# Patient Record
Sex: Male | Born: 1965 | Race: White | Hispanic: No | Marital: Married | Smoking: Current every day smoker
Health system: Southern US, Community
[De-identification: ages and names within clinical notes are randomized; demographics above are authoritative.]

## PROBLEM LIST (undated history)

## (undated) DIAGNOSIS — Z8639 Personal history of other endocrine, nutritional and metabolic disease: Secondary | ICD-10-CM

## (undated) DIAGNOSIS — J439 Emphysema, unspecified: Secondary | ICD-10-CM

## (undated) DIAGNOSIS — F419 Anxiety disorder, unspecified: Secondary | ICD-10-CM

## (undated) DIAGNOSIS — M169 Osteoarthritis of hip, unspecified: Secondary | ICD-10-CM

## (undated) DIAGNOSIS — Z8709 Personal history of other diseases of the respiratory system: Secondary | ICD-10-CM

## (undated) DIAGNOSIS — Z9089 Acquired absence of other organs: Secondary | ICD-10-CM

## (undated) DIAGNOSIS — Z8042 Family history of malignant neoplasm of prostate: Secondary | ICD-10-CM

## (undated) DIAGNOSIS — C61 Malignant neoplasm of prostate: Secondary | ICD-10-CM

## (undated) DIAGNOSIS — D110 Benign neoplasm of parotid gland: Secondary | ICD-10-CM

## (undated) DIAGNOSIS — M519 Unspecified thoracic, thoracolumbar and lumbosacral intervertebral disc disorder: Secondary | ICD-10-CM

## (undated) DIAGNOSIS — M5430 Sciatica, unspecified side: Secondary | ICD-10-CM

## (undated) DIAGNOSIS — J342 Deviated nasal septum: Secondary | ICD-10-CM

## (undated) DIAGNOSIS — K5730 Diverticulosis of large intestine without perforation or abscess without bleeding: Secondary | ICD-10-CM

## (undated) DIAGNOSIS — R102 Pelvic and perineal pain unspecified side: Secondary | ICD-10-CM

## (undated) DIAGNOSIS — T8859XA Other complications of anesthesia, initial encounter: Secondary | ICD-10-CM

## (undated) DIAGNOSIS — T4145XA Adverse effect of unspecified anesthetic, initial encounter: Secondary | ICD-10-CM

## (undated) DIAGNOSIS — Z807 Family history of other malignant neoplasms of lymphoid, hematopoietic and related tissues: Secondary | ICD-10-CM

## (undated) DIAGNOSIS — E041 Nontoxic single thyroid nodule: Secondary | ICD-10-CM

## (undated) DIAGNOSIS — G8929 Other chronic pain: Secondary | ICD-10-CM

## (undated) HISTORY — PX: TONSILLECTOMY: SUR1361

## (undated) HISTORY — DX: Emphysema, unspecified: J43.9

## (undated) HISTORY — DX: Family history of other malignant neoplasms of lymphoid, hematopoietic and related tissues: Z80.7

## (undated) HISTORY — PX: VASECTOMY: SHX75

## (undated) HISTORY — DX: Family history of malignant neoplasm of prostate: Z80.42

## (undated) HISTORY — PX: COLONOSCOPY: SHX174

## (undated) HISTORY — DX: Nontoxic single thyroid nodule: E04.1

## (undated) HISTORY — PX: PROSTATE BIOPSY: SHX241

## (undated) HISTORY — DX: Acquired absence of other organs: Z90.89

## (undated) HISTORY — PX: PAROTID GLAND TUMOR EXCISION: SHX5221

---

## 1994-07-08 HISTORY — PX: LUMBAR LAMINECTOMY: SHX95

## 2005-04-05 ENCOUNTER — Ambulatory Visit: Payer: Self-pay | Admitting: Internal Medicine

## 2006-03-28 ENCOUNTER — Ambulatory Visit: Payer: Self-pay | Admitting: Internal Medicine

## 2006-08-15 ENCOUNTER — Ambulatory Visit: Payer: Self-pay | Admitting: Internal Medicine

## 2006-09-12 ENCOUNTER — Ambulatory Visit: Payer: Self-pay | Admitting: Internal Medicine

## 2007-04-27 ENCOUNTER — Encounter: Admission: RE | Admit: 2007-04-27 | Discharge: 2007-04-27 | Payer: Self-pay | Admitting: Orthopedic Surgery

## 2010-05-08 ENCOUNTER — Ambulatory Visit: Payer: Self-pay | Admitting: Family Medicine

## 2010-05-08 DIAGNOSIS — S0083XA Contusion of other part of head, initial encounter: Secondary | ICD-10-CM

## 2010-05-08 DIAGNOSIS — S0003XA Contusion of scalp, initial encounter: Secondary | ICD-10-CM | POA: Insufficient documentation

## 2010-05-08 DIAGNOSIS — S139XXA Sprain of joints and ligaments of unspecified parts of neck, initial encounter: Secondary | ICD-10-CM | POA: Insufficient documentation

## 2010-05-08 DIAGNOSIS — S1093XA Contusion of unspecified part of neck, initial encounter: Secondary | ICD-10-CM

## 2010-05-08 DIAGNOSIS — J209 Acute bronchitis, unspecified: Secondary | ICD-10-CM | POA: Insufficient documentation

## 2010-05-10 ENCOUNTER — Telehealth: Payer: Self-pay | Admitting: Family Medicine

## 2010-05-18 DIAGNOSIS — R51 Headache: Secondary | ICD-10-CM | POA: Insufficient documentation

## 2010-05-18 DIAGNOSIS — R519 Headache, unspecified: Secondary | ICD-10-CM | POA: Insufficient documentation

## 2010-05-21 ENCOUNTER — Ambulatory Visit: Payer: Self-pay | Admitting: Internal Medicine

## 2010-05-28 ENCOUNTER — Ambulatory Visit: Payer: Self-pay | Admitting: Family Medicine

## 2010-05-28 DIAGNOSIS — IMO0002 Reserved for concepts with insufficient information to code with codable children: Secondary | ICD-10-CM | POA: Insufficient documentation

## 2010-06-19 ENCOUNTER — Telehealth: Payer: Self-pay | Admitting: Family Medicine

## 2010-06-19 DIAGNOSIS — M5430 Sciatica, unspecified side: Secondary | ICD-10-CM | POA: Insufficient documentation

## 2010-07-17 ENCOUNTER — Encounter: Payer: Self-pay | Admitting: Family Medicine
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
## 2010-08-07 NOTE — Progress Notes (Signed)
Summary: neurology  Phone Note Call from Patient   Caller: Patient Call For: [phone] Summary of Call: [phone] Mom states his symptoms are no better and want a referral to a neurologist ASAP.  Dr. Clent Ridges off this afternoon. Initial call taken by: [facility] CMA AAMA,  May 10, 2010 1:34 PM  Follow-up for Phone Call        there is no need to see a neurologist at this point. If he is still having a HA, set him up today for a non-contrasted head CT scan  Follow-up by: Nelwyn Salisbury MD,  May 11, 2010 10:25 AM  Additional Follow-up for Phone Call Additional follow up Details #1::        I left a message at [phone] asking mom to return my call. Tried [phone] also and it states phone is off or out of service area. Additional Follow-up by: Josph Macho RMA,  May 11, 2010 10:31 AM    Additional Follow-up for Phone Call Additional follow up Details #2::    spoke with mom and she will call back if patient would like a CT. Follow-up by: Kern Reap CMA Duncan Dull),  May 11, 2010 3:13 PM  Additional Follow-up for Phone Call Additional follow up Details #3:: Details for Additional Follow-up Action Taken: have not received call back yet  Additional Follow-up by: Pura Spice, RN,  May 15, 2010 8:23 AM   Appended Document: neurology pt called back and wants referral to have CT call him back at [phone]  symptoms "tingle sensation " across the scalp  Appended Document: CT Scan head referral   per Dr Clent Ridges sch a non contrasted head CT for headaches.referral request sent to [facility].   Clinical Lists Changes  Problems: Added new problem of HEADACHE (ICD-784.0) - Signed Orders: Added new Referral order of Radiology Referral (Radiology) - Signed
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
## 2010-08-09 NOTE — Letter (Signed)
Summary: [facility]   Imported By: Maryln Gottron 07/26/2010 10:16:40  _____________________________________________________________________  External Attachment:    Type:   Image     Comment:   External Document

## 2010-08-09 NOTE — Progress Notes (Signed)
Summary: wants referral  Phone Note Call from Patient Call back at Work Phone [phone]   Caller: Patient---live call Summary of Call: sciatica nerve is still causing him problems in his left leg. was told to call if referral is needed. please referral. Initial call taken by: Warnell Forester,  June 19, 2010 10:50 AM  Follow-up for Phone Call        refer to Dr. Sheran Luz for left sciatica pain  Follow-up by: Nelwyn Salisbury MD,  June 20, 2010 9:54 AM  New Problems: SCIATICA, ACUTE (ICD-724.3)   New Problems: SCIATICA, ACUTE (ICD-724.3)

## 2011-01-08 ENCOUNTER — Other Ambulatory Visit: Payer: Self-pay | Admitting: Neurological Surgery

## 2011-01-08 DIAGNOSIS — M545 Low back pain, unspecified: Secondary | ICD-10-CM

## 2011-01-14 ENCOUNTER — Ambulatory Visit
Admission: RE | Admit: 2011-01-14 | Discharge: 2011-01-14 | Disposition: A | Discharge status: Home / Self Care | Payer: BC Managed Care – PPO | Source: Ambulatory Visit | Attending: Neurological Surgery | Admitting: Neurological Surgery

## 2011-01-14 DIAGNOSIS — M545 Low back pain, unspecified: Secondary | ICD-10-CM

## 2011-01-14 MED ORDER — GADOBENATE DIMEGLUMINE 529 MG/ML IV SOLN
18.0000 mL | Freq: Once | INTRAVENOUS | Status: AC | PRN
  Administered 2011-01-14: 18 mL via INTRAVENOUS

## 2012-03-31 ENCOUNTER — Encounter: Payer: Self-pay | Admitting: Family Medicine

## 2012-03-31 ENCOUNTER — Ambulatory Visit: Payer: BC Managed Care – PPO | Admitting: Family Medicine

## 2013-12-23 ENCOUNTER — Telehealth: Payer: Self-pay | Admitting: Family Medicine

## 2013-12-23 NOTE — Telephone Encounter (Signed)
Can you call pt to schedule the office visit?

## 2013-12-23 NOTE — Telephone Encounter (Signed)
lmovm for pt to sch appt

## 2013-12-23 NOTE — Telephone Encounter (Signed)
Pt's wife called to ask for a rx cialis. Pt states he gets from urologist and he is out of town. Does pt need appt? Pt would rather come here now for this rx b.c urologist is so costly. pls advise [facility] [place]

## 2013-12-23 NOTE — Telephone Encounter (Signed)
I can prescribe this for him but he will need an OV. We have not seen him in years

## 2013-12-27 ENCOUNTER — Ambulatory Visit (INDEPENDENT_AMBULATORY_CARE_PROVIDER_SITE_OTHER): Payer: Self-pay | Admitting: Family Medicine

## 2013-12-27 ENCOUNTER — Encounter: Payer: Self-pay | Admitting: Family Medicine

## 2013-12-27 VITALS — BP 100/76 | HR 86 | Temp 98.6°F | Wt 194.5 lb

## 2013-12-27 DIAGNOSIS — N529 Male erectile dysfunction, unspecified: Secondary | ICD-10-CM

## 2013-12-27 DIAGNOSIS — M25569 Pain in unspecified knee: Secondary | ICD-10-CM

## 2013-12-27 DIAGNOSIS — M25561 Pain in right knee: Secondary | ICD-10-CM

## 2013-12-27 MED ORDER — SILDENAFIL CITRATE 50 MG PO TABS: 50.0000 mg | ORAL_TABLET | Freq: Every day | ORAL | Status: DC | PRN

## 2013-12-27 NOTE — Patient Instructions (Signed)
-  tylenol 500-1000mg  up to 3 times daily or aleve twice daily for pain as needed  -please do the home exercise programs  -follow up with Dr. Sarajane Jews in 1 month

## 2013-12-27 NOTE — Progress Notes (Signed)
No chief complaint on file.   HPI:  Acute visit for Knee Pain: -PCP, Dr. Sarajane Jews, not available -started: L knee pain started 4 months ago -reports: on an off since then in knee joint and above knee, very active, gardner - squatting up and down - this is limiting activities -denies: fevers, malaise, weakness, numbness, swelling, redness, weight loss  ED: -[facility] and wants [facility]   ROS: See pertinent positives and negatives per HPI.  Past Medical History  Diagnosis Date  . History of tonsillectomy     Past Surgical History  Procedure Laterality Date  . Lumbar laminectomy  1996  . Tonsillectomy      Family History  Problem Relation Age of Onset  . Coronary artery disease    . Lung cancer      History   Social History  . Marital Status: Married    Spouse Name: N/A    Number of Children: N/A  . Years of Education: N/A   Social History Main Topics  . Smoking status: Current Every Day Smoker    Types: E-cigarettes  . Smokeless tobacco: None  . Alcohol Use: None  . Drug Use: None  . Sexual Activity: None   Other Topics Concern  . None   Social History Narrative  . None    Current outpatient prescriptions:ACETAMINOPHEN PO, Take by mouth as needed. , Disp: , Rfl: ;  aspirin 325 MG tablet, Take 325 mg by mouth as needed. , Disp: , Rfl: ;  sildenafil (VIAGRA) 50 MG tablet, Take 1 tablet (50 mg total) by mouth daily as needed for erectile dysfunction., Disp: 10 tablet, Rfl: 0  EXAM:  Filed Vitals:   12/27/13 1603  BP: 100/76  Pulse: 86  Temp: 98.6 F (37 C)    There is no height on file to calculate BMI.  GENERAL: vitals reviewed and listed above, alert, oriented, appears well hydrated and in no acute distress  HEENT: atraumatic, conjunttiva clear, no obvious abnormalities on inspection of external nose and ears  NECK: no obvious masses on inspection  LUNGS: clear to auscultation bilaterally, no wheezes, rales or  rhonchi, good air movement  CV: HRRR, no peripheral edema  MS: moves all extremities without noticeable abnormality, gait normal, normal inspection, no swleling or redness, TTP mild Quad tendon, no jt line ttp, no pat crepitus, neg lachman, neg val/var stress, neg mcmurry, neg single leg squat  PSYCH: pleasant and cooperative, no obvious depression or anxiety  ASSESSMENT AND PLAN:  Discussed the following assessment and plan:  Erectile dysfunction, unspecified erectile dysfunction type - Plan: sildenafil (VIAGRA) 50 MG tablet  Right knee pain  -? Tendonopathy, disucssed options, offered plain films - opted for trial HEP -discussed risks viagra, refilled for one month, further refills per PCP -Patient advised to return or notify a doctor immediately if symptoms worsen or persist or new concerns arise.  Patient Instructions  -tylenol 500-1000mg  up to 3 times daily or aleve twice daily for pain as needed  -please do the home exercise programs  -follow up with Dr. Sarajane Jews in 1 month     KIM, Nickola Major.

## 2013-12-27 NOTE — Progress Notes (Signed)
Pre visit review using our clinic review tool, if applicable. No additional management support is needed unless otherwise documented below in the visit note. 

## 2013-12-28 ENCOUNTER — Telehealth: Payer: Self-pay | Admitting: Family Medicine

## 2013-12-28 NOTE — Telephone Encounter (Signed)
Relevant patient education mailed to patient.  

## 2013-12-29 NOTE — Telephone Encounter (Signed)
Pt came in to see dr Maudie Mercury for a foot issue. Was also given viagra at visit.

## 2014-01-10 ENCOUNTER — Other Ambulatory Visit: Payer: Self-pay | Admitting: Family Medicine

## 2014-01-11 ENCOUNTER — Telehealth: Payer: Self-pay | Admitting: Family Medicine

## 2014-01-11 NOTE — Telephone Encounter (Signed)
Pharmacy request to clarify dosage on Viagra 50 mg or 100 mg? [facility] for 100 mg.

## 2014-01-13 NOTE — Telephone Encounter (Signed)
This should be for 100 mg of Viagra

## 2014-01-13 NOTE — Telephone Encounter (Signed)
Pt called to check the status of his Viagra 100 mg. He confirms his pharmacy is [facility] on [address]

## 2014-01-14 MED ORDER — SILDENAFIL CITRATE 100 MG PO TABS: 100.0000 mg | ORAL_TABLET | Freq: Every day | ORAL | Status: DC | PRN

## 2014-01-14 NOTE — Telephone Encounter (Signed)
I sent script e-scribe and spoke with pt. 

## 2015-06-12 ENCOUNTER — Encounter: Payer: Self-pay | Admitting: Family Medicine

## 2015-06-12 ENCOUNTER — Ambulatory Visit (INDEPENDENT_AMBULATORY_CARE_PROVIDER_SITE_OTHER): Payer: Self-pay | Admitting: Family Medicine

## 2015-06-12 VITALS — BP 102/67 | HR 90 | Temp 98.7°F | Ht 71.0 in | Wt 198.0 lb

## 2015-06-12 DIAGNOSIS — Z91048 Other nonmedicinal substance allergy status: Secondary | ICD-10-CM

## 2015-06-12 DIAGNOSIS — M25512 Pain in left shoulder: Secondary | ICD-10-CM

## 2015-06-12 DIAGNOSIS — L0292 Furuncle, unspecified: Secondary | ICD-10-CM

## 2015-06-12 DIAGNOSIS — Z9109 Other allergy status, other than to drugs and biological substances: Secondary | ICD-10-CM

## 2015-06-12 MED ORDER — CYCLOBENZAPRINE HCL 10 MG PO TABS: 10.0000 mg | ORAL_TABLET | Freq: Three times a day (TID) | ORAL | Status: DC | PRN

## 2015-06-12 MED ORDER — DOXYCYCLINE HYCLATE 100 MG PO CAPS: 100.0000 mg | ORAL_CAPSULE | Freq: Two times a day (BID) | ORAL | Status: AC

## 2015-06-12 NOTE — Progress Notes (Signed)
   Subjective:    Patient ID: Gabriel Griffith, male    DOB: 1966/05/09, 49 y.o.   MRN: [id number]  HPI Here for several issues. 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 This causes the area to swell and be painful. No fevers. Currently he has had a flare for 2 weeks. 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 He uses Ibuprofen and heat with little relief. This is getting slowly worse. No hx of trauma. Third he has had PND and some hoarseness for about 6 months. No sinus pain or fever or cough.    Review of Systems  Constitutional: Negative.   HENT: Positive for facial swelling, postnasal drip and voice change. Negative for congestion, dental problem, ear pain, sinus pressure and sore throat.   Eyes: Negative.   Respiratory: Negative.   Musculoskeletal: Positive for arthralgias.       Objective:   Physical Exam  Constitutional: He appears well-developed and well-nourished.  HENT:  Right Ear: External ear normal.  Left Ear: External ear normal.  Nose: Nose normal.  Mouth/Throat: Oropharynx is clear and moist.  Large area of swelling and tenderness anterior to the left ear. This appears to be from a cluster of sebaceous cyasts   Eyes: Conjunctivae are normal.  Neck: Neck supple. No thyromegaly present.  Pulmonary/Chest: Effort normal and breath sounds normal.  Musculoskeletal:  The left shoulder has severely limited ROM and is tender   Lymphadenopathy:    He has no cervical adenopathy.          Assessment & Plan:  He seems to have allergies so try Claritin 10 mg daily. He has a frozen shoulder and we will refer him to Orthopedics. Try Flexeril and heat. He has an infected boil on the face, treat with Doxycycline.

## 2015-06-12 NOTE — Progress Notes (Signed)
Pre visit review using our clinic review tool, if applicable. No additional management support is needed unless otherwise documented below in the visit note. 

## 2015-12-25 ENCOUNTER — Other Ambulatory Visit: Payer: Self-pay | Admitting: Specialist

## 2015-12-25 DIAGNOSIS — K118 Other diseases of salivary glands: Secondary | ICD-10-CM

## 2015-12-26 ENCOUNTER — Ambulatory Visit
Admission: RE | Admit: 2015-12-26 | Discharge: 2015-12-26 | Disposition: A | Discharge status: Home / Self Care | Payer: No Typology Code available for payment source | Source: Ambulatory Visit | Attending: Specialist | Admitting: Specialist

## 2015-12-26 DIAGNOSIS — K118 Other diseases of salivary glands: Secondary | ICD-10-CM

## 2015-12-26 MED ORDER — IOPAMIDOL (ISOVUE-300) INJECTION 61%
75.0000 mL | Freq: Once | INTRAVENOUS | Status: AC | PRN
  Administered 2015-12-26: 75 mL via INTRAVENOUS

## 2016-01-17 ENCOUNTER — Ambulatory Visit (INDEPENDENT_AMBULATORY_CARE_PROVIDER_SITE_OTHER): Payer: Self-pay | Admitting: Family Medicine

## 2016-01-17 ENCOUNTER — Encounter: Payer: Self-pay | Admitting: Family Medicine

## 2016-01-17 VITALS — BP 102/70 | HR 72 | Temp 98.0°F | Ht 71.0 in | Wt 201.9 lb

## 2016-01-17 DIAGNOSIS — Z01818 Encounter for other preprocedural examination: Secondary | ICD-10-CM

## 2016-01-17 DIAGNOSIS — H6012 Cellulitis of left external ear: Secondary | ICD-10-CM

## 2016-01-17 MED ORDER — DOXYCYCLINE HYCLATE 100 MG PO CAPS: 100.0000 mg | ORAL_CAPSULE | Freq: Two times a day (BID) | ORAL | Status: DC

## 2016-01-17 NOTE — Progress Notes (Signed)
Pre visit review using our clinic review tool, if applicable. No additional management support is needed unless otherwise documented below in the visit note. 

## 2016-01-17 NOTE — Progress Notes (Signed)
   Subjective:    Patient ID: Gabriel Griffith, male    DOB: 12/11/1965, 50 y.o.   MRN: [id number]  HPI Patient seen for the following issues  Presurgical clearance. Large parotid mass. Scheduled surgery in 1 week. Etiology of mass unclear this time. Patient quit smoking 2 years ago. No known chronic lung disease. No history of cardiac disease. Denies any recent exertional dyspnea or chest pain. Does not exercise regularly but does moderate exertion with no symptoms. Has not had any recent stress testing or EKG.  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 About a week ago started noticing some increased redness and warmth of ear lobe with mild drainage. No known history of MRSA. Has taken doxycycline in the past.  Past Medical History  Diagnosis Date  . History of tonsillectomy    Past Surgical History  Procedure Laterality Date  . Lumbar laminectomy  1996  . Tonsillectomy      reports that he quit smoking about 2 years ago. His smoking use included E-cigarettes. He has never used smokeless tobacco. He reports that he uses illicit drugs (Marijuana). He reports that he does not drink alcohol. family history is not on file. No Known Allergies    Review of Systems  Constitutional: Negative for fever, chills and fatigue.  Eyes: Negative for visual disturbance.  Respiratory: Negative for cough, chest tightness and shortness of breath.   Cardiovascular: Negative for chest pain, palpitations and leg swelling.  Neurological: Negative for dizziness, syncope, weakness, light-headedness and headaches.  Hematological: Negative for adenopathy.       Objective:   Physical Exam  Constitutional: He is oriented to person, place, and time. He appears well-developed and well-nourished.  HENT:  Patient has large parotid mass. Nontender. Left ear lobe reveals some mild erythema, slightly swollen with slightly yellow crusted center. No  fluctuance. Nontender.  Neck: Neck supple.  No carotid bruits  Cardiovascular: Normal rate and regular rhythm.  Exam reveals no gallop.   No murmur heard. Pulmonary/Chest: Effort normal and breath sounds normal. No respiratory distress. He has no wheezes. He has no rales.  Abdominal: Soft. Bowel sounds are normal. He exhibits no distension and no mass. There is no tenderness. There is no rebound and no guarding.  Musculoskeletal: He exhibits no edema.  Lymphadenopathy:    He has no cervical adenopathy.  Neurological: He is alert and oriented to person, place, and time. No cranial nerve deficit.          Assessment & Plan:  #1 presurgical clearance. Patient has no cardiac history. EKG shows sinus rhythm with no acute ST-T changes. He has past history of smoking but no advanced chronic lung disease. Pulse oximetry 97% at rest. Patient tolerant of moderate exertion without difficulties. No known contraindications for surgery  #2 mild cellulitis left earlobe. No evidence for fluctuant abscess. Recommend start doxycycline 100 mg twice a day for 7 days. Warm compresses. Make sure surgeon is aware and patient will notify them regarding this  Eulas Post MD [facility]

## 2017-02-17 ENCOUNTER — Ambulatory Visit (INDEPENDENT_AMBULATORY_CARE_PROVIDER_SITE_OTHER): Payer: Self-pay | Admitting: Family Medicine

## 2017-02-17 ENCOUNTER — Encounter: Payer: Self-pay | Admitting: Family Medicine

## 2017-02-17 VITALS — BP 98/77 | HR 84 | Ht 71.0 in | Wt 191.0 lb

## 2017-02-17 DIAGNOSIS — Z85820 Personal history of malignant melanoma of skin: Secondary | ICD-10-CM

## 2017-02-17 DIAGNOSIS — E041 Nontoxic single thyroid nodule: Secondary | ICD-10-CM

## 2017-02-17 LAB — T4, FREE: FREE T4: 1.08 ng/dL (ref 0.60–1.60)

## 2017-02-17 LAB — TSH: TSH: 1.17 u[IU]/mL (ref 0.35–4.50)

## 2017-02-17 LAB — T3, FREE: T3 FREE: 3.3 pg/mL (ref 2.3–4.2)

## 2017-02-17 NOTE — Progress Notes (Signed)
   Subjective:    Patient ID: Gabriel Griffith, male    DOB: 1966-06-10, 51 y.o.   MRN: [id number]  HPI Here with some questions about a thyroid nodule. 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 Towanda Malkin. As part of his preop workup he had a CT scan of the neck on 12-26-15 which showed a 3-4 mm nodule in the right thyroid lobe. An [place] was advised to work this up further but with his surgery pending this did not happen. He now is worried about it, especially in light of his hx of melanoma.    Review of Systems  Constitutional: Negative.   HENT: Negative.   Eyes: Negative.   Respiratory: Negative.   Cardiovascular: Negative.   Endocrine: Negative.   Neurological: Negative.        Objective:   Physical Exam  Constitutional: He appears well-developed and well-nourished.  HENT:  Right Ear: External ear normal.  Left Ear: External ear normal.  Nose: Nose normal.  Mouth/Throat: Oropharynx is clear and moist.  Eyes: Pupils are equal, round, and reactive to light. Conjunctivae are normal.  Neck: Neck supple. No thyromegaly present.  No nodules are felt in the thyroid   Cardiovascular: Normal rate, regular rhythm, normal heart sounds and intact distal pulses.   Pulmonary/Chest: Effort normal and breath sounds normal. No respiratory distress. He has no wheezes. He has no rales.  Lymphadenopathy:    He has no cervical adenopathy.          Assessment & Plan:  He has a hx of a thyroid nodule that needs to be worked up. He has a hx of melanoma resection and he is a former smoker, so we will set up a CXR soon.  Alysia Penna, MD

## 2017-02-17 NOTE — Patient Instructions (Signed)
WE NOW OFFER   [facility] FAST TRACK!!!  SAME DAY Appointments for ACUTE CARE  Such as: Sprains, Injuries, cuts, abrasions, rashes, muscle pain, joint pain, back pain Colds, flu, sore throats, headache, allergies, cough, fever  Ear pain, sinus and eye infections Abdominal pain, nausea, vomiting, diarrhea, upset stomach Animal/insect bites  3 Easy Ways to Schedule: Walk-In Scheduling Call in scheduling Mychart Sign-up: [email or web address]         

## 2017-02-18 ENCOUNTER — Ambulatory Visit
Admission: RE | Admit: 2017-02-18 | Discharge: 2017-02-18 | Disposition: A | Discharge status: Home / Self Care | Payer: No Typology Code available for payment source | Source: Ambulatory Visit | Attending: Family Medicine | Admitting: Family Medicine

## 2017-02-18 DIAGNOSIS — E041 Nontoxic single thyroid nodule: Secondary | ICD-10-CM

## 2017-02-19 ENCOUNTER — Ambulatory Visit (INDEPENDENT_AMBULATORY_CARE_PROVIDER_SITE_OTHER)
Admission: RE | Admit: 2017-02-19 | Discharge: 2017-02-19 | Disposition: A | Discharge status: Home / Self Care | Payer: Self-pay | Source: Ambulatory Visit | Attending: Family Medicine | Admitting: Family Medicine

## 2017-02-19 ENCOUNTER — Telehealth: Payer: Self-pay | Admitting: Family Medicine

## 2017-02-19 DIAGNOSIS — Z85820 Personal history of malignant melanoma of skin: Secondary | ICD-10-CM

## 2017-02-19 MED ORDER — DOXYCYCLINE HYCLATE 100 MG PO CAPS: 100.0000 mg | ORAL_CAPSULE | Freq: Two times a day (BID) | ORAL | 0 refills | Status: DC

## 2017-02-19 NOTE — Telephone Encounter (Signed)
I spoke with pt and went over below information. 

## 2017-02-19 NOTE — Addendum Note (Signed)
Addended by: Alysia Penna A on: 02/19/2017 05:30 PM   Modules accepted: Orders

## 2017-02-19 NOTE — Telephone Encounter (Signed)
Pt states he thought he was to start a regiman of doxycycline (VIBRAMYCIN) 100 MG capsule   [id number] [place] [place] - [address]   pt would like results of ultrasound and chest xray

## 2017-02-19 NOTE — Telephone Encounter (Signed)
See my Result Notes. I sent in a month supply of Doxycycline.

## 2017-02-24 ENCOUNTER — Telehealth: Payer: Self-pay | Admitting: Family Medicine

## 2017-02-24 NOTE — Telephone Encounter (Signed)
Dr. Sarajane Jews  I spoke with pt and he states he understands your message but he states wither the results come back that his nodule is non cancerous or not he would like to get this removed because it is causing him so much discomfort,

## 2017-02-24 NOTE — Telephone Encounter (Signed)
I spoke with pt and made him aware of Dr. Barbie Banner message. Pt understood and agreed to wait. He did state that he just wants to assure that whomever that he referrers him to, specializes in thyroid removal. I spoke with Dr. Sarajane Jews and he states he completely understands.  He is willing to go outside of [place] if needed. Nothing further is needed

## 2017-02-24 NOTE — Telephone Encounter (Signed)
° ° ° ° °  Pt would like a referral to [facility] for his Thyroid. He said he contacted the office and they him he needed a referral

## 2017-02-24 NOTE — Telephone Encounter (Signed)
I am not sure why he is asking for this referral . He is scheduled for an US guided biopsy for this Wed at 4:15 at [facility]. We need the results from this to decide about what the next step should be

## 2017-02-24 NOTE — Telephone Encounter (Signed)
Tell him I understand and it will probably need to be removed, but the surgeons will want the biopsy to be complete before they will schedule a visit. I'm sure this will move along fairly quickly.

## 2017-02-24 NOTE — Telephone Encounter (Signed)
Please see previous message and advise 

## 2017-02-26 ENCOUNTER — Other Ambulatory Visit (HOSPITAL_COMMUNITY)
Admission: RE | Admit: 2017-02-26 | Discharge: 2017-02-26 | Disposition: A | Discharge status: Home / Self Care | Payer: No Typology Code available for payment source | Source: Ambulatory Visit | Attending: Radiology | Admitting: Radiology

## 2017-02-26 ENCOUNTER — Ambulatory Visit
Admission: RE | Admit: 2017-02-26 | Discharge: 2017-02-26 | Disposition: A | Discharge status: Home / Self Care | Payer: No Typology Code available for payment source | Source: Ambulatory Visit | Attending: Family Medicine | Admitting: Family Medicine

## 2017-02-26 DIAGNOSIS — E041 Nontoxic single thyroid nodule: Secondary | ICD-10-CM

## 2017-02-28 NOTE — Addendum Note (Signed)
Addended by: Alysia Penna A on: 02/28/2017 12:38 PM   Modules accepted: Orders

## 2017-03-05 ENCOUNTER — Encounter: Payer: Self-pay | Admitting: Family Medicine

## 2017-03-05 ENCOUNTER — Ambulatory Visit (INDEPENDENT_AMBULATORY_CARE_PROVIDER_SITE_OTHER): Payer: Self-pay | Admitting: Family Medicine

## 2017-03-05 VITALS — BP 100/71 | HR 78 | Temp 98.2°F | Ht 71.0 in | Wt 191.0 lb

## 2017-03-05 DIAGNOSIS — E041 Nontoxic single thyroid nodule: Secondary | ICD-10-CM | POA: Insufficient documentation

## 2017-03-05 DIAGNOSIS — J018 Other acute sinusitis: Secondary | ICD-10-CM

## 2017-03-05 MED ORDER — AMOXICILLIN-POT CLAVULANATE 875-125 MG PO TABS: 1.0000 | ORAL_TABLET | Freq: Two times a day (BID) | ORAL | 0 refills | Status: DC

## 2017-03-05 NOTE — Progress Notes (Signed)
   Subjective:    Patient ID: Gabriel Griffith, male    DOB: [birth year], 51 y.o.   MRN: [id number]  HPI Here for 2 weeks of sinus congestion, pressure in both ears, and PND. No ST or cough. He took a week of Doxycycline which did not help. The biopsy of the thyroid nodule returned as benign but he is interested in having it removed, so he is scheduled to meet with Dr. Armandina Gemma to discuss this on 03-26-17.    Review of Systems  Constitutional: Negative.   HENT: Positive for congestion, ear pain, postnasal drip, sinus pain and sinus pressure. Negative for facial swelling and sore throat.   Eyes: Negative.   Respiratory: Negative.        Objective:   Physical Exam  Constitutional: He appears well-developed and well-nourished.  HENT:  Right Ear: External ear normal.  Left Ear: External ear normal.  Nose: Nose normal.  Mouth/Throat: Oropharynx is clear and moist. No oropharyngeal exudate.  Eyes: Conjunctivae are normal.  Neck: Neck supple.  Pulmonary/Chest: Effort normal and breath sounds normal. No respiratory distress. He has no wheezes. He has no rales.  Lymphadenopathy:    He has no cervical adenopathy.          Assessment & Plan:  Sinusitis, treat with Augmentin. Alysia Penna, MD

## 2017-03-05 NOTE — Patient Instructions (Signed)
WE NOW OFFER   [facility] FAST TRACK!!!  SAME DAY Appointments for ACUTE CARE  Such as: Sprains, Injuries, cuts, abrasions, rashes, muscle pain, joint pain, back pain Colds, flu, sore throats, headache, allergies, cough, fever  Ear pain, sinus and eye infections Abdominal pain, nausea, vomiting, diarrhea, upset stomach Animal/insect bites  3 Easy Ways to Schedule: Walk-In Scheduling Call in scheduling Mychart Sign-up: [email or web address]         

## 2017-03-28 ENCOUNTER — Telehealth: Payer: Self-pay | Admitting: Family Medicine

## 2017-03-28 DIAGNOSIS — E041 Nontoxic single thyroid nodule: Secondary | ICD-10-CM

## 2017-03-28 NOTE — Telephone Encounter (Signed)
I did the referral to Endocrine

## 2017-03-28 NOTE — Telephone Encounter (Signed)
Pt states he has seen the general surgeon and they advised pt to see an Endocrinologist to see if he is a candidate for surgery.  Pt needs referral to Endo

## 2017-03-31 NOTE — Telephone Encounter (Signed)
I left a voice message with below information. 

## 2017-04-04 ENCOUNTER — Encounter: Payer: Self-pay | Admitting: Family Medicine

## 2017-04-04 ENCOUNTER — Ambulatory Visit (INDEPENDENT_AMBULATORY_CARE_PROVIDER_SITE_OTHER): Payer: Self-pay | Admitting: Family Medicine

## 2017-04-04 VITALS — BP 119/87 | HR 90 | Temp 98.6°F | Ht 71.0 in | Wt 188.0 lb

## 2017-04-04 DIAGNOSIS — J029 Acute pharyngitis, unspecified: Secondary | ICD-10-CM

## 2017-04-04 DIAGNOSIS — E041 Nontoxic single thyroid nodule: Secondary | ICD-10-CM

## 2017-04-04 LAB — CBC WITH DIFFERENTIAL/PLATELET
BASOS ABS: 0 10*3/uL (ref 0.0–0.1)
Basophils Relative: 0.5 % (ref 0.0–3.0)
EOS ABS: 0.1 10*3/uL (ref 0.0–0.7)
Eosinophils Relative: 0.7 % (ref 0.0–5.0)
HCT: 47.7 % (ref 39.0–52.0)
Hemoglobin: 16 g/dL (ref 13.0–17.0)
LYMPHS PCT: 12.6 % (ref 12.0–46.0)
Lymphs Abs: 1.2 10*3/uL (ref 0.7–4.0)
MCHC: 33.6 g/dL (ref 30.0–36.0)
MCV: 97.4 fl (ref 78.0–100.0)
Monocytes Absolute: 0.7 10*3/uL (ref 0.1–1.0)
Monocytes Relative: 7 % (ref 3.0–12.0)
NEUTROS ABS: 7.8 10*3/uL — AB (ref 1.4–7.7)
NEUTROS PCT: 79.2 % — AB (ref 43.0–77.0)
PLATELETS: 279 10*3/uL (ref 150.0–400.0)
RBC: 4.89 Mil/uL (ref 4.22–5.81)
RDW: 14.4 % (ref 11.5–15.5)
WBC: 9.8 10*3/uL (ref 4.0–10.5)

## 2017-04-04 LAB — BASIC METABOLIC PANEL
BUN: 11 mg/dL (ref 6–23)
CALCIUM: 9.7 mg/dL (ref 8.4–10.5)
CO2: 28 meq/L (ref 19–32)
Chloride: 105 mEq/L (ref 96–112)
Creatinine, Ser: 0.99 mg/dL (ref 0.40–1.50)
GFR: 84.14 mL/min (ref >60.00)
Glucose, Bld: 94 mg/dL (ref 70–99)
Potassium: 4.6 mEq/L (ref 3.5–5.1)
Sodium: 139 mEq/L (ref 135–145)

## 2017-04-04 LAB — MONONUCLEOSIS SCREEN: MONO SCREEN: NEGATIVE

## 2017-04-04 NOTE — Patient Instructions (Signed)
WE NOW OFFER   [facility] FAST TRACK!!!  SAME DAY Appointments for ACUTE CARE  Such as: Sprains, Injuries, cuts, abrasions, rashes, muscle pain, joint pain, back pain Colds, flu, sore throats, headache, allergies, cough, fever  Ear pain, sinus and eye infections Abdominal pain, nausea, vomiting, diarrhea, upset stomach Animal/insect bites  3 Easy Ways to Schedule: Walk-In Scheduling Call in scheduling Mychart Sign-up: [email or web address]         

## 2017-04-04 NOTE — Progress Notes (Signed)
   Subjective:    Patient ID: Gabriel Griffith, male    DOB: [birth year], 51 y.o.   MRN: [id number]  HPI Here for continued symptoms of stuffy head, PND, ST, and a dry cough. He has had courses of Doxycycline and Augmentin with no benefit. He had a CXR recently that was clear.    Review of Systems  Constitutional: Negative.   HENT: Positive for congestion, postnasal drip, sinus pain, sinus pressure and sore throat.   Eyes: Negative.   Respiratory: Positive for cough. Negative for shortness of breath and wheezing.   Cardiovascular: Negative.        Objective:   Physical Exam  Constitutional: He appears well-developed and well-nourished.  HENT:  Right Ear: External ear normal.  Left Ear: External ear normal.  Nose: Nose normal.  Mouth/Throat: Oropharynx is clear and moist.  Eyes: Conjunctivae are normal.  Neck: Neck supple. No thyromegaly present.  Pulmonary/Chest: Effort normal and breath sounds normal. No respiratory distress. He has no wheezes. He has no rales.  Lymphadenopathy:    He has no cervical adenopathy.          Assessment & Plan:  Partially treated sinusitis, treat with Levaquin. Check labs. He will see Dr. Loanne Drilling on 04-26-17 for the thyroid nodule.  Alysia Penna, MD

## 2017-04-18 ENCOUNTER — Encounter: Payer: Self-pay | Admitting: Family Medicine

## 2017-04-18 ENCOUNTER — Ambulatory Visit (INDEPENDENT_AMBULATORY_CARE_PROVIDER_SITE_OTHER): Payer: Self-pay | Admitting: Family Medicine

## 2017-04-18 VITALS — BP 117/81 | HR 81 | Temp 98.1°F | Ht 71.0 in | Wt 188.0 lb

## 2017-04-18 DIAGNOSIS — R102 Pelvic and perineal pain: Secondary | ICD-10-CM

## 2017-04-18 DIAGNOSIS — R1030 Lower abdominal pain, unspecified: Secondary | ICD-10-CM

## 2017-04-18 LAB — BASIC METABOLIC PANEL
BUN: 14 mg/dL (ref 6–23)
CALCIUM: 8.6 mg/dL (ref 8.4–10.5)
CO2: 27 mEq/L (ref 19–32)
CREATININE: 1.09 mg/dL (ref 0.40–1.50)
Chloride: 106 mEq/L (ref 96–112)
GFR: 75.29 mL/min (ref >60.00)
GLUCOSE: 90 mg/dL (ref 70–99)
Potassium: 4.5 mEq/L (ref 3.5–5.1)
SODIUM: 140 meq/L (ref 135–145)

## 2017-04-18 LAB — HEPATIC FUNCTION PANEL
ALK PHOS: 62 U/L (ref 39–117)
ALT: 17 U/L (ref 0–53)
AST: 14 U/L (ref 0–37)
Albumin: 4.5 g/dL (ref 3.5–5.2)
BILIRUBIN DIRECT: 0.2 mg/dL (ref 0.0–0.3)
TOTAL PROTEIN: 6.6 g/dL (ref 6.0–8.3)
Total Bilirubin: 0.8 mg/dL (ref 0.2–1.2)

## 2017-04-18 LAB — CBC WITH DIFFERENTIAL/PLATELET
BASOS PCT: 0.3 % (ref 0.0–3.0)
Basophils Absolute: 0 10*3/uL (ref 0.0–0.1)
EOS PCT: 1.1 % (ref 0.0–5.0)
Eosinophils Absolute: 0.1 10*3/uL (ref 0.0–0.7)
HEMATOCRIT: 46.3 % (ref 39.0–52.0)
HEMOGLOBIN: 15.7 g/dL (ref 13.0–17.0)
LYMPHS PCT: 11.3 % — AB (ref 12.0–46.0)
Lymphs Abs: 1.1 10*3/uL (ref 0.7–4.0)
MCHC: 34 g/dL (ref 30.0–36.0)
MCV: 97 fl (ref 78.0–100.0)
Monocytes Absolute: 0.7 10*3/uL (ref 0.1–1.0)
Monocytes Relative: 7.5 % (ref 3.0–12.0)
NEUTROS ABS: 7.5 10*3/uL (ref 1.4–7.7)
Neutrophils Relative %: 79.8 % — ABNORMAL HIGH (ref 43.0–77.0)
PLATELETS: 300 10*3/uL (ref 150.0–400.0)
RBC: 4.77 Mil/uL (ref 4.22–5.81)
RDW: 14.5 % (ref 11.5–15.5)
WBC: 9.4 10*3/uL (ref 4.0–10.5)

## 2017-04-18 LAB — POC URINALSYSI DIPSTICK (AUTOMATED)
BILIRUBIN UA: NEGATIVE
Blood, UA: NEGATIVE
GLUCOSE UA: NEGATIVE
Ketones, UA: NEGATIVE
Leukocytes, UA: NEGATIVE
Nitrite, UA: NEGATIVE
Protein, UA: NEGATIVE
SPEC GRAV UA: 1.015 (ref 1.010–1.025)
UROBILINOGEN UA: 0.2 U/dL
pH, UA: 6 (ref 5.0–8.0)

## 2017-04-18 LAB — PSA: PSA: 6.89 ng/mL — ABNORMAL HIGH (ref 0.10–4.00)

## 2017-04-18 NOTE — Patient Instructions (Signed)
WE NOW OFFER   [facility] FAST TRACK!!!  SAME DAY Appointments for ACUTE CARE  Such as: Sprains, Injuries, cuts, abrasions, rashes, muscle pain, joint pain, back pain Colds, flu, sore throats, headache, allergies, cough, fever  Ear pain, sinus and eye infections Abdominal pain, nausea, vomiting, diarrhea, upset stomach Animal/insect bites  3 Easy Ways to Schedule: Walk-In Scheduling Call in scheduling Mychart Sign-up: [email or web address]         

## 2017-04-18 NOTE — Progress Notes (Signed)
   Subjective:    Patient ID: Gabriel Griffith, male    DOB: May 19, 1966, 51 y.o.   MRN: [id number]  HPI Here to discuss chronic lower abdominal and pelvic pain. He has dealt with pelvic pain for at least 10 years, though we have never discussed it until today. He saw Dr. Rosana Hoes at [facility] numerous times and took several rounds of antibiotics for presumed UTI's or epididymitis episodes, but these never really helped. He had a cystoscopy in 2009 which was normal. He even underwent pelvic floor manipulation therapy but this did not help either. Now for the past few years he has simply been putting up with the pain. However in the past few months this has worsened and he asks for help. He has no trouble wit urinating, no frequency or burning. His BMs are regular. He does describe achy pains the pubic area, in both groin areas, and in the rectal and perineal areas. Sometimes ejaculations are painful. No penile DC or bleeding has been seen.    Review of Systems  Constitutional: Negative.   Respiratory: Negative.   Cardiovascular: Negative.   Gastrointestinal: Positive for rectal pain. Negative for abdominal distention, abdominal pain, anal bleeding, blood in stool, constipation, diarrhea, nausea and vomiting.  Genitourinary: Positive for testicular pain. Negative for decreased urine volume, difficulty urinating, discharge, dysuria, enuresis, flank pain, frequency, genital sores, hematuria, penile pain, penile swelling, scrotal swelling and urgency.       Objective:   Physical Exam  Constitutional: He appears well-developed and well-nourished. No distress.  Cardiovascular: Normal rate, regular rhythm, normal heart sounds and intact distal pulses.   Pulmonary/Chest: Effort normal and breath sounds normal. No respiratory distress. He has no wheezes. He has no rales.  Abdominal: Soft. Bowel sounds are normal. He exhibits no distension and no mass. There is no tenderness. There is no rebound and  no guarding.  Genitourinary: Rectum normal, prostate normal and penis normal. Rectal exam shows guaiac negative stool. No penile tenderness.  Genitourinary Comments: No inguinal adenopathy          Assessment & Plan:  Chronic pelvic pain. We will get labs today including a UA. He has never had imaging of the pelvic area so we will set up a CT of the abdomen and pelvis soon.  Alysia Penna, MD

## 2017-04-19 ENCOUNTER — Emergency Department (HOSPITAL_COMMUNITY)
Admission: EM | Admit: 2017-04-19 | Discharge: 2017-04-19 | Disposition: A | Discharge status: Home / Self Care | Payer: Self-pay | Attending: Emergency Medicine | Admitting: Emergency Medicine

## 2017-04-19 ENCOUNTER — Encounter (HOSPITAL_COMMUNITY): Payer: Self-pay | Admitting: Emergency Medicine

## 2017-04-19 DIAGNOSIS — Z5321 Procedure and treatment not carried out due to patient leaving prior to being seen by health care provider: Secondary | ICD-10-CM | POA: Insufficient documentation

## 2017-04-19 DIAGNOSIS — R079 Chest pain, unspecified: Secondary | ICD-10-CM | POA: Insufficient documentation

## 2017-04-19 LAB — BASIC METABOLIC PANEL
Anion gap: 7 (ref 5–15)
BUN: 12 mg/dL (ref 6–20)
CHLORIDE: 107 mmol/L (ref 101–111)
CO2: 26 mmol/L (ref 22–32)
Calcium: 9 mg/dL (ref 8.9–10.3)
Creatinine, Ser: 1.02 mg/dL (ref 0.61–1.24)
GFR calc non Af Amer: >60 mL/min (ref >60)
Glucose, Bld: 96 mg/dL (ref 65–99)
POTASSIUM: 4.1 mmol/L (ref 3.5–5.1)
SODIUM: 140 mmol/L (ref 135–145)

## 2017-04-19 LAB — CBC
HEMATOCRIT: 44.3 % (ref 39.0–52.0)
HEMOGLOBIN: 15.3 g/dL (ref 13.0–17.0)
MCH: 32.8 pg (ref 26.0–34.0)
MCHC: 34.5 g/dL (ref 30.0–36.0)
MCV: 94.9 fL (ref 78.0–100.0)
Platelets: 288 10*3/uL (ref 150–400)
RBC: 4.67 MIL/uL (ref 4.22–5.81)
RDW: 13.9 % (ref 11.5–15.5)
WBC: 9.2 10*3/uL (ref 4.0–10.5)

## 2017-04-19 LAB — I-STAT TROPONIN, ED: Troponin i, poc: 0 ng/mL (ref 0.00–0.08)

## 2017-04-19 NOTE — ED Notes (Signed)
[name], EMT checked pt's vitals while RN was in room but did not document them.  RN came to waiting room to recheck to document and per Robin, EMT pt said he decided he was going to leave and is no longer here.

## 2017-04-19 NOTE — ED Triage Notes (Signed)
C/o intermittent dull aching pain to L chest that radiates to back and both sides of neck x 12 weeks.  Also reports shooting pain to bilateral arms.  Feels indigestion and "fullness to abdomen."  States it feels like his "spleen is swollen."  Also reports loss of appetite.

## 2017-04-21 ENCOUNTER — Telehealth: Payer: Self-pay | Admitting: Family Medicine

## 2017-04-21 NOTE — Telephone Encounter (Signed)
Pt would like blood work results °

## 2017-04-22 ENCOUNTER — Ambulatory Visit
Admission: RE | Admit: 2017-04-22 | Discharge: 2017-04-22 | Disposition: A | Discharge status: Home / Self Care | Payer: Self-pay | Source: Ambulatory Visit | Attending: Family Medicine | Admitting: Family Medicine

## 2017-04-22 DIAGNOSIS — R1030 Lower abdominal pain, unspecified: Secondary | ICD-10-CM

## 2017-04-22 DIAGNOSIS — R102 Pelvic and perineal pain: Secondary | ICD-10-CM

## 2017-04-22 MED ORDER — IOPAMIDOL (ISOVUE-300) INJECTION 61%
100.0000 mL | Freq: Once | INTRAVENOUS | Status: AC | PRN
  Administered 2017-04-22: 100 mL via INTRAVENOUS

## 2017-04-22 NOTE — Telephone Encounter (Signed)
Patient called requesting his lab results. Please contact patient [phone]

## 2017-04-22 NOTE — Telephone Encounter (Signed)
See result note page.

## 2017-04-23 ENCOUNTER — Other Ambulatory Visit: Payer: Self-pay | Admitting: Family Medicine

## 2017-04-23 MED ORDER — CIPROFLOXACIN HCL 500 MG PO TABS: 500.0000 mg | ORAL_TABLET | Freq: Two times a day (BID) | ORAL | 0 refills | Status: DC

## 2017-04-23 MED ORDER — METRONIDAZOLE 500 MG PO TABS: 500.0000 mg | ORAL_TABLET | Freq: Three times a day (TID) | ORAL | 0 refills | Status: DC

## 2017-04-23 NOTE — Telephone Encounter (Signed)
I spoke with pt and went over lab results.

## 2017-04-28 ENCOUNTER — Ambulatory Visit (INDEPENDENT_AMBULATORY_CARE_PROVIDER_SITE_OTHER): Payer: Self-pay | Admitting: Endocrinology

## 2017-04-28 ENCOUNTER — Telehealth: Payer: Self-pay | Admitting: Family Medicine

## 2017-04-28 ENCOUNTER — Encounter: Payer: Self-pay | Admitting: Endocrinology

## 2017-04-28 DIAGNOSIS — G8929 Other chronic pain: Secondary | ICD-10-CM

## 2017-04-28 DIAGNOSIS — R232 Flushing: Secondary | ICD-10-CM

## 2017-04-28 DIAGNOSIS — R61 Generalized hyperhidrosis: Secondary | ICD-10-CM | POA: Insufficient documentation

## 2017-04-28 DIAGNOSIS — H9201 Otalgia, right ear: Principal | ICD-10-CM

## 2017-04-28 NOTE — Patient Instructions (Addendum)
blood tests, and a 24-HR urine test,  are requested for you today.  We'll let you know about the results.   Please come back for a follow-up appointment in 1 year.

## 2017-04-28 NOTE — Progress Notes (Signed)
Subjective:    Patient ID: Gabriel Griffith, male    DOB: 12-23-1965, 51 y.o.   MRN: [id number]  HPI Pt is referred by Dr Sarajane Jews, for nodular thyroid.  On 2017 CT, pt was incidentally noted to have a nodule at the thyroid.  He is unaware of ever having had thyroid problems in the past.  He has no h/o XRT or surgery to the neck, except resection of left parotid tumor.  He has severe fatigue, and assoc weight loss (9 lbs x 12 weeks-unintentional).   Home HIV test was neg a few days ago.   Past Medical History:  Diagnosis Date  . History of tonsillectomy     Past Surgical History:  Procedure Laterality Date  . LUMBAR LAMINECTOMY  1996  . PAROTID GLAND TUMOR EXCISION    . TONSILLECTOMY      Social History   Social History  . Marital status: Married    Spouse name: N/A  . Number of children: N/A  . Years of education: N/A   Occupational History  . Not on file.   Social History Main Topics  . Smoking status: Former Smoker    Packs/day: 1.00    Years: 35.00    Types: Cigarettes  . Smokeless tobacco: Former Systems developer    Quit date: 04/20/2017     Comment: started E cigarettes 2015   . Alcohol use No  . Drug use: Yes    Types: Marijuana  . Sexual activity: Not on file   Other Topics Concern  . Not on file   Social History Narrative  . No narrative on file    Current Outpatient Prescriptions on File Prior to Visit  Medication Sig Dispense Refill  . ciprofloxacin (CIPRO) 500 MG tablet Take 1 tablet (500 mg total) by mouth 2 (two) times daily. 20 tablet 0  . cyclobenzaprine (FLEXERIL) 10 MG tablet Take 1 tablet (10 mg total) by mouth 3 (three) times daily as needed for muscle spasms. 60 tablet 2  . metroNIDAZOLE (FLAGYL) 500 MG tablet Take 1 tablet (500 mg total) by mouth 3 (three) times daily. 30 tablet 0   No current facility-administered medications on file prior to visit.     No Known Allergies  Family History  Problem Relation Age of Onset  . Coronary artery disease  Unknown   . Lung cancer Unknown   . Hypothyroidism Mother     BP 108/76 (BP Location: Left Arm, Patient Position: Sitting, Cuff Size: Normal)   Pulse 100   Temp 98.4 F (36.9 C) (Oral)   Resp 14   Ht 5' 11.5" (1.816 m)   Wt 185 lb 6 oz (84.1 kg)   SpO2 97%   BMI 25.49 kg/m     Review of Systems Denies fever, hoarseness, neck pain, visual loss, chest pain, sob, cough, dysphagia, diarrhea, itching, easy bruising, depression, cold intolerance, headache, numbness, and rhinorrhea.  He reports night sweats, flushing, sore throat, ear congestion, jaw pain, leg numbness, hoarseness, chest wall pain, chronic pelvic pain, dizziness, and abd bloating.      Objective:   Physical Exam VS: see vs page GEN: no distress HEAD: head: no deformity eyes: no periorbital swelling, no proptosis external nose and ears are normal.  mouth: no lesion seen.  NECK: a healed scar is present at the left lat neck.  The right thyroid nodule is palpable with swallowing only.   CHEST WALL: no deformity LUNGS: clear to auscultation CV: reg rate and rhythm, no murmur  ABD: abdomen is soft, nontender.  no hepatosplenomegaly.  not distended.  no hernia MUSCULOSKELETAL: muscle bulk and strength are grossly normal.  no obvious joint swelling.  gait is normal and steady EXTEMITIES: no deformity.  no ulcer on the feet.  feet are of normal color and temp.  no edema.   PULSES: dorsalis pedis intact bilat.  no carotid bruit NEURO:  cn 2-12 grossly intact.   readily moves all 4's.  sensation is intact to touch on the feet.  SKIN:  Normal texture and temperature.  No rash or suspicious lesion is visible.   NODES:  None palpable at the neck.  PSYCH: alert, well-oriented.  Does not appear anxious nor depressed.     Lab Results  Component Value Date   TSH 1.17 02/17/2017   Exophytic 3-4 cm right thyroid nodule with some extension into the anterior superior mediastinum.   [place]: Location: Right; Inferior.  Maximum size:  3.1 cm; Other 2 dimensions: 1.8 x 2.0 cm.    BENIGN FOLLICULAR NODULE (BETHESDA CATEGORY II).    I have reviewed outside records, and summarized: Pt was noted to have thyroid nodule, and referred here.  Pt reported multiple sxs, but worst was pelvic pain.     Assessment & Plan:  Thyroid nodule.  bx is low-risk Flushing, and other sxs, new to me.  uncertain etiology elev PSA: he requests f/u, as the recent was done right after DRE.     Patient Instructions  blood tests, and a 24-HR urine test,  are requested for you today.  We'll let you know about the results.   Please come back for a follow-up appointment in 1 year.

## 2017-04-28 NOTE — Telephone Encounter (Signed)
° ° ° ° °  Pt said he is also having some anxiety issues and is asking if something can be called in for him. He said it happen mostly at night but now it is starting to happen some doing the day .     Pharmacy  [facility]

## 2017-04-28 NOTE — Telephone Encounter (Signed)
° ° ° ° °  Pt cal lto say he is still having issues with his right ear. He has had ear and sore throat pay for about 12 weeks and is requesting a referral to see a ENT.

## 2017-04-29 NOTE — Telephone Encounter (Signed)
I did the referral to ENT. Also call in Lorazepam 1 mg to take bid prn anxiety., #60 with no rf

## 2017-04-30 ENCOUNTER — Other Ambulatory Visit: Payer: Self-pay

## 2017-04-30 DIAGNOSIS — R232 Flushing: Secondary | ICD-10-CM

## 2017-04-30 MED ORDER — LORAZEPAM 1 MG PO TABS: 1.0000 mg | ORAL_TABLET | Freq: Two times a day (BID) | ORAL | 0 refills | Status: DC | PRN

## 2017-04-30 NOTE — Telephone Encounter (Signed)
I called in [facility] and spoke with pt.

## 2017-05-02 LAB — CALCITONIN: CALCITONIN: 2 pg/mL (ref <=10)

## 2017-05-06 LAB — METANEPHRINES, URINE, 24 HOUR
METANEPH TOTAL UR: 702 ug/(24.h) (ref 224–832)
Metanephrines, Ur: 301 mcg/24 h (ref 90–315)
NORMETANEPHRINE 24H UR: 401 ug/(24.h) (ref 122–676)
Volume, Urine-VMAUR: 1000 mL

## 2017-05-06 LAB — CATECHOLAMINES, FRACTIONATED, URINE, 24 HOUR
CALCULATED TOTAL (E+ NE): 174 ug/(24.h) — AB (ref 26–121)
Creatinine, Urine mg/day-CATEUR: 1.61 g/(24.h) (ref 0.50–2.15)
Dopamine, 24 hr Urine: 249 mcg/24 h (ref 52–480)
EPINEPHRINE, 24 HR URINE: 20 ug/(24.h) (ref 2–24)
Norepinephrine, 24 hr Ur: 154 mcg/24 h — ABNORMAL HIGH (ref 15–100)
Volume, Urine-VMAUR: 1000 mL

## 2017-05-08 ENCOUNTER — Telehealth: Payer: Self-pay | Admitting: Family Medicine

## 2017-05-08 NOTE — Telephone Encounter (Signed)
° ° ° °  Pt call to say when he had labs done a few weeks ago his psa was elevated and he is calling to ask if he need to have another psa test done

## 2017-05-08 NOTE — Telephone Encounter (Signed)
We do need to repeat the PSA, but this is elevated by inflammation or infection. We need to wait until all his pelvic symptoms are gone before we repeat it

## 2017-05-09 NOTE — Telephone Encounter (Signed)
I spoke with pt and went over message.

## 2017-05-09 NOTE — Telephone Encounter (Signed)
I left a voice message for pt to return my call.  

## 2017-05-09 NOTE — Telephone Encounter (Signed)
PT IS returning [facility] Griffith call

## 2017-05-28 ENCOUNTER — Telehealth: Payer: Self-pay | Admitting: Family Medicine

## 2017-05-28 MED ORDER — LORAZEPAM 1 MG PO TABS: 1.0000 mg | ORAL_TABLET | Freq: Two times a day (BID) | ORAL | 5 refills | Status: DC | PRN

## 2017-05-28 NOTE — Telephone Encounter (Signed)
Rx was called into pt's pharmacy. 

## 2017-05-28 NOTE — Telephone Encounter (Signed)
Rx request 

## 2017-05-28 NOTE — Telephone Encounter (Signed)
Copied from [place] [id number]. Topic: Quick Communication - See Telephone Encounter >> May 28, 2017 11:38 AM Ahmed Prima L wrote: CRM for notification. See Telephone encounter for:  Pt needs a refill on his LORAZEPAM. Only has 5 pills left. [facility] [address]  05/28/17.

## 2017-05-28 NOTE — Telephone Encounter (Signed)
Call in #60 with 5 rf 

## 2017-05-28 NOTE — Telephone Encounter (Signed)
Sent to PCP for refill approval.

## 2017-06-06 ENCOUNTER — Ambulatory Visit (INDEPENDENT_AMBULATORY_CARE_PROVIDER_SITE_OTHER): Payer: Self-pay | Admitting: Family Medicine

## 2017-06-06 ENCOUNTER — Encounter: Payer: Self-pay | Admitting: Family Medicine

## 2017-06-06 VITALS — BP 126/74 | HR 85 | Temp 98.1°F | Resp 12 | Ht 71.5 in | Wt 189.5 lb

## 2017-06-06 DIAGNOSIS — R197 Diarrhea, unspecified: Secondary | ICD-10-CM

## 2017-06-06 DIAGNOSIS — K5732 Diverticulitis of large intestine without perforation or abscess without bleeding: Secondary | ICD-10-CM

## 2017-06-06 DIAGNOSIS — R1030 Lower abdominal pain, unspecified: Secondary | ICD-10-CM

## 2017-06-06 LAB — CBC WITH DIFFERENTIAL/PLATELET
BASOS ABS: 0 10*3/uL (ref 0.0–0.1)
BASOS PCT: 0.4 % (ref 0.0–3.0)
EOS ABS: 0.1 10*3/uL (ref 0.0–0.7)
Eosinophils Relative: 0.7 % (ref 0.0–5.0)
HEMATOCRIT: 44.2 % (ref 39.0–52.0)
HEMOGLOBIN: 14.7 g/dL (ref 13.0–17.0)
LYMPHS PCT: 12.2 % (ref 12.0–46.0)
Lymphs Abs: 1.5 10*3/uL (ref 0.7–4.0)
MCHC: 33.3 g/dL (ref 30.0–36.0)
MCV: 97.4 fl (ref 78.0–100.0)
MONOS PCT: 8.9 % (ref 3.0–12.0)
Monocytes Absolute: 1.1 10*3/uL — ABNORMAL HIGH (ref 0.1–1.0)
NEUTROS ABS: 9.4 10*3/uL — AB (ref 1.4–7.7)
Neutrophils Relative %: 77.8 % — ABNORMAL HIGH (ref 43.0–77.0)
Platelets: 309 10*3/uL (ref 150.0–400.0)
RBC: 4.54 Mil/uL (ref 4.22–5.81)
RDW: 14 % (ref 11.5–15.5)
WBC: 12 10*3/uL — AB (ref 4.0–10.5)

## 2017-06-06 MED ORDER — SULFAMETHOXAZOLE-TRIMETHOPRIM 800-160 MG PO TABS: 1.0000 | ORAL_TABLET | Freq: Two times a day (BID) | ORAL | 0 refills | Status: AC

## 2017-06-06 MED ORDER — METRONIDAZOLE 500 MG PO TABS: 500.0000 mg | ORAL_TABLET | Freq: Three times a day (TID) | ORAL | 0 refills | Status: AC

## 2017-06-06 NOTE — Patient Instructions (Signed)
  [name] I have seen you today for an acute visit.  A few things to remember from today's visit:   Lower abdominal pain - Plan: C. difficile GDH and Toxin A/B, CBC with Differential/Platelet  Diarrhea, unspecified type - Plan: C. difficile GDH and Toxin A/B  Diverticulitis, colon - Plan: sulfamethoxazole-trimethoprim (BACTRIM DS,SEPTRA DS) 800-160 MG tablet, metroNIDAZOLE (FLAGYL) 500 MG tablet   Medications prescribed today are intended for short period of time and will not be refill upon request, a follow up appointment might be necessary to discuss continuation of of treatment if appropriate.  Follow a bland diet for the next few days, small meals, adequate hydration. Daily probiotic (Align) 1 cap daily.  GET HELP RIGHT AWAY IF:   The pain is does not go away within 2 hours.  Sudden severe/worsening pain.  You keep throwing up (vomiting).  The pain changes and is only in the right or left part of the belly.  Not being able to pass gas or poop.  You have bloody or tarry looking poop.   MAKE SURE YOU:   Understand these instructions.  Will watch your condition.  Will get help right away if you are not doing well or get worse.   If symptoms are persistent please arrange a follow up appointment.      In general please monitor for signs of worsening symptoms and seek immediate medical attention if any concerning.  If symptoms are not resolved in a few days/weeks you should schedule a follow up appointment with your doctor, before if needed.  I hope you get better soon!

## 2017-06-06 NOTE — Progress Notes (Addendum)
ACUTE VISIT   HPI:  Chief Complaint  Patient presents with  . Diverticulitis Flare-up    [name] is a 51 y.o. male, who is here today complaining of 5 days of lower abdominal pain, which he attributes to diverticulitis.  He had similar symptoms in 04/2017 and according to patient, he was treated for diverticulitis. He completed treatment with Cipro and Metronidazole.  He would like to try a different antibiotic, reporting side effects with Ciprofloxacin, "achy tendons."  He describes pain as "terrible cramping", "gasy pain" In "the whole pelvic" area. Pain is interfering with sleep. 6/10 intermittent pain. He also has associated diarrhea, today he has had one stool.  Yesterday he had 2-3. He is not aware of sick contact and denies recent travel.  He denies nausea, vomiting, blood in the stool, or urinary symptoms.  Urination seems to alleviate suprapubic pain.  He is not sure about fever because he is taking Tylenol around-the-clock, yesterday he has temp 99.5 F   Abdominal-pelvic CT 04/22/17: Hepatic and LEFT renal cysts. Sigmoid diverticulosis without definite pericolic inflammatory changes to suggest acute diverticulitis though the distal sigmoid wall is questionably mildly thickened; this could reflect sequela of prior diverticulitis or muscular hypertrophy but subtle acute diverticulosis not entirely excluded though considered less likely.  Prostatic enlargement.  Lab Results  Component Value Date   WBC 9.2 04/19/2017   HGB 15.3 04/19/2017   HCT 44.3 04/19/2017   MCV 94.9 04/19/2017   PLT 288 04/19/2017   U/A negative 04/18/17.  Review of Systems  Constitutional: Positive for activity change, appetite change and fatigue.  HENT: Negative for mouth sores, sore throat and trouble swallowing.   Eyes: Negative for redness and visual disturbance.  Respiratory: Negative for shortness of breath and wheezing.   Cardiovascular: Negative for  palpitations and leg swelling.  Gastrointestinal: Positive for abdominal pain and diarrhea. Negative for blood in stool, nausea and vomiting.  Genitourinary: Negative for decreased urine volume, dysuria and hematuria.  Musculoskeletal: Negative for back pain and myalgias.  Skin: Negative for pallor and rash.  Neurological: Negative for syncope, weakness and headaches.  Hematological: Negative for adenopathy. Does not bruise/bleed easily.  Psychiatric/Behavioral: Positive for sleep disturbance. Negative for confusion. The patient is nervous/anxious.      Current Outpatient Medications on File Prior to Visit  Medication Sig Dispense Refill  . cyclobenzaprine (FLEXERIL) 10 MG tablet Take 1 tablet (10 mg total) by mouth 3 (three) times daily as needed for muscle spasms. 60 tablet 2  . LORazepam (ATIVAN) 1 MG tablet Take 1 tablet (1 mg total) by mouth 2 (two) times daily as needed for anxiety. 60 tablet 5   No current facility-administered medications on file prior to visit.      Past Medical History:  Diagnosis Date  . History of tonsillectomy    No Known Allergies  Social History   Socioeconomic History  . Marital status: Married    Spouse name: None  . Number of children: None  . Years of education: None  . Highest education level: None  Social Needs  . Financial resource strain: None  . Food insecurity - worry: None  . Food insecurity - inability: None  . Transportation needs - medical: None  . Transportation needs - non-medical: None  Occupational History  . None  Tobacco Use  . Smoking status: Former Smoker    Packs/day: 1.00    Years: 35.00    Pack years: 35.00  Types: Cigarettes  . Smokeless tobacco: Former Systems developer    Quit date: 04/20/2017  . Tobacco comment: started E cigarettes 2015   Substance and Sexual Activity  . Alcohol use: No    Alcohol/week: 0.0 oz  . Drug use: Yes    Types: Marijuana  . Sexual activity: None  Other Topics Concern  . None  Social  History Narrative  . None    Vitals:   06/06/17 1033  BP: 126/74  Pulse: 85  Resp: 12  Temp: 98.1 F (36.7 C)  SpO2: 97%   Body mass index is 26.06 kg/m.   Physical Exam  Nursing note and vitals reviewed. Constitutional: He is oriented to person, place, and time. He appears well-developed and well-nourished. No distress.  HENT:  Head: Normocephalic and atraumatic.  Mouth/Throat: Oropharynx is clear and moist and mucous membranes are normal.  Eyes: Conjunctivae are normal.  Cardiovascular: Normal rate and regular rhythm.  No murmur heard. Respiratory: Effort normal and breath sounds normal. No respiratory distress.  GI: Soft. Bowel sounds are normal. He exhibits no mass. There is tenderness in the suprapubic area and left lower quadrant. There is no rebound, no guarding and no CVA tenderness.  Mild tenderness suprapubic > LLQ.  Musculoskeletal: He exhibits no edema or tenderness.  Lymphadenopathy:    He has no cervical adenopathy.       Right: No supraclavicular adenopathy present.       Left: No supraclavicular adenopathy present.  Neurological: He is alert and oriented to person, place, and time. He has normal strength. Gait normal.  Skin: Skin is warm. No rash noted. No erythema.  Psychiatric: His mood appears anxious.  Well-groomed, good eye contact.    ASSESSMENT AND PLAN:   [name] was seen today for diverticulitis flare-up.  Diagnoses and all orders for this visit:  Lab Results  Component Value Date   WBC 12.0 (H) 06/06/2017   HGB 14.7 06/06/2017   HCT 44.2 06/06/2017   MCV 97.4 06/06/2017   PLT 309.0 06/06/2017    Lower abdominal pain  Pain seems to be more pronounced in the suprapubic area and LLQ.  History of pelvic pain, she does not feel like this pain is related to his chronic pain. No signs of acute abdomen, so I will obtain ER evaluation or imaging are needed at this time. We discussed possible etiologies, I am not certain about  diverticulitis. He does not have colonoscopy in file. Instructed about warning signs.  He follows with urology and has an appt next week.  -     C. difficile GDH and Toxin A/B; Future -     CBC with Differential/Platelet   Diarrhea, unspecified type  Because recent antibiotic treatment as well as abdominal cramps and diarrhea I think we need to evaluate for C. difficile infection. We discussed side effects of antibiotics. A daily OTC probiotic might help. Adequate hydration. Follow-up with PCP as needed.  -     C. difficile GDH and Toxin A/B; Future   Diverticulitis, colon  We discussed options, including repeating abdominal CT, symptomatic treatment, or treating empirically with antibiotics. Side effects of antibiotics discussed. He was clearly instructed about warning signs. Bland diet. He will follow-up with PCP in 5-6 days, before if needed.   -     sulfamethoxazole-trimethoprim (BACTRIM DS,SEPTRA DS) 800-160 MG tablet; Take 1 tablet by mouth 2 (two) times daily for 7 days. -     metroNIDAZOLE (FLAGYL) 500 MG tablet; Take 1 tablet (500 mg  total) by mouth 3 (three) times daily for 7 days.   Return in about 6 days (around 06/12/2017) for abd pain with PCP.     -Mr.Miriam [employer] was advised to seek immediate medical attention if sudden worsening symptoms.      Anahid Eskelson G. [place], MD  [facility]. [place] office.

## 2017-06-07 LAB — C. DIFFICILE GDH AND TOXIN A/B
GDH ANTIGEN: NOT DETECTED
MICRO NUMBER:: 81348201
SPECIMEN QUALITY:: ADEQUATE
TOXIN A AND B: NOT DETECTED

## 2017-06-09 ENCOUNTER — Telehealth: Payer: Self-pay | Admitting: Family Medicine

## 2017-06-09 NOTE — Telephone Encounter (Signed)
Copied from [place]. Topic: Quick Communication - See Telephone Encounter >> Jun 09, 2017  3:07 PM Cleaster Corin, NT wrote: CRM for notification. See Telephone encounter for:   06/09/17. Pt. Calling back to get lab results. Labs where done on Friday 06/06/17 please give pt. A call when results are in [phone]

## 2017-06-10 ENCOUNTER — Telehealth: Payer: Self-pay | Admitting: Family Medicine

## 2017-06-10 DIAGNOSIS — R972 Elevated prostate specific antigen [PSA]: Secondary | ICD-10-CM

## 2017-06-10 NOTE — Telephone Encounter (Signed)
Called patient and left message to return call

## 2017-06-10 NOTE — Telephone Encounter (Signed)
Pt. called for results on C-Diff stool culture. Advised of negative C-Diff results. Reported his symptoms are improving, since taking the antibiotics.  Stated the abdominal pain and bladder tenderness have improved.  Questions about continuing the antibiotics.  Advised pt. To finish the antibiotic as prescribed.  Pt. Also questions about having another PSA checked; has appt. 07/10/17 for a prostate biopsy.  Pt. stated he feels the PSA may have been elevated, due to the increased discomfort of lower abdomen and bladder, with the infection.  Is asking if Dr. Sarajane Jews will consider rechecking the PSA in latter part of December, prior to prostate bx. On 07/10/17?  Advised will send message to Dr. Sarajane Jews.  Pt. Agreed.

## 2017-06-11 NOTE — Telephone Encounter (Signed)
Okay. I went ahead and placed a future order for this. He can have this drawn at the end of December

## 2017-06-11 NOTE — Telephone Encounter (Signed)
Sent to PCP pt is asking for Dr. Sarajane Jews will consider rechecking the PSA in latter part of December, prior to prostate bx. On 07/10/17?  Advised will send message to Dr. Sarajane Jews.  Pt. Agreed.

## 2017-06-11 NOTE — Telephone Encounter (Signed)
Spoke with pt. Pt advised and voiced understanding.  

## 2017-06-20 ENCOUNTER — Encounter: Payer: Self-pay | Admitting: Family Medicine

## 2017-06-20 ENCOUNTER — Encounter: Payer: Self-pay | Admitting: Internal Medicine

## 2017-06-20 ENCOUNTER — Ambulatory Visit (INDEPENDENT_AMBULATORY_CARE_PROVIDER_SITE_OTHER): Payer: Self-pay | Admitting: Family Medicine

## 2017-06-20 VITALS — BP 120/60 | HR 94 | Temp 98.5°F | Wt 189.6 lb

## 2017-06-20 DIAGNOSIS — R300 Dysuria: Secondary | ICD-10-CM

## 2017-06-20 DIAGNOSIS — R1030 Lower abdominal pain, unspecified: Secondary | ICD-10-CM

## 2017-06-20 DIAGNOSIS — M5440 Lumbago with sciatica, unspecified side: Secondary | ICD-10-CM

## 2017-06-20 NOTE — Progress Notes (Signed)
   Subjective:    Patient ID: Gabriel Griffith, male    DOB: 01/21/1966, 51 y.o.   MRN: [id number]  HPI Here for ongoing pelvic and abdominal symptoms. He saw Dr. Martinique on 06-06-17 for lower abdominal pain and it was felt he had a recurrence of diverticulitis. He was given a round of Flagyl and Bactrim DS. He was checked for C diff, and this was negative. The antibiotics helped but he still has some mild residual pain in the lower abdomen and pelvic area. He is here today because he has also developed a mild aching pain in the middle back over the kidneys. No fever. In addition he describes several weeks of numbness, tingling, and burning pains in the lower back, both buttocks, and both posterior thighs. No leg weakness. He saw Dr. Karsten Ro of urology last week and his PSA is still elevated to 6.3, and he is scheduled for a prostate biopsy on 07-10-17.   Review of Systems  Constitutional: Negative.   Respiratory: Negative.   Cardiovascular: Negative.   Gastrointestinal: Positive for abdominal pain. Negative for abdominal distention, anal bleeding, blood in stool, constipation, diarrhea, nausea, rectal pain and vomiting.  Genitourinary: Positive for flank pain. Negative for difficulty urinating, dysuria, frequency, hematuria, testicular pain and urgency.  Musculoskeletal: Positive for back pain.  Neurological: Positive for numbness. Negative for weakness.       Objective:   Physical Exam  Constitutional: He is oriented to person, place, and time. He appears well-developed and well-nourished.  Cardiovascular: Normal rate, regular rhythm, normal heart sounds and intact distal pulses.  Pulmonary/Chest: Effort normal and breath sounds normal. No respiratory distress. He has no wheezes. He has no rales.  Abdominal: Soft. Bowel sounds are normal. He exhibits no distension and no mass. There is no tenderness. There is no rebound and no guarding.  Neurological: He is alert and oriented to person,  place, and time.  (a UA today is clear )        Assessment & Plan:  He has a wife variety of lower back, abdominal and pelvic symptoms that are difficult to explain. He will have a prostate biopsy as above for the elevated PSA. He has had several rounds of antibiotics recently with no clear results. We will refer him to GI to evaluate for a possible colonoscopy. We will aet up an MRI of the LS spine to evaluate for a possible cauda equina syndrome.  Alysia Penna, MD

## 2017-07-15 ENCOUNTER — Other Ambulatory Visit: Payer: Self-pay | Admitting: Orthopedic Surgery

## 2017-07-15 DIAGNOSIS — M25551 Pain in right hip: Secondary | ICD-10-CM

## 2017-07-18 ENCOUNTER — Ambulatory Visit
Admission: RE | Admit: 2017-07-18 | Discharge: 2017-07-18 | Disposition: A | Discharge status: Home / Self Care | Payer: BLUE CROSS/BLUE SHIELD | Source: Ambulatory Visit | Attending: Family Medicine | Admitting: Family Medicine

## 2017-07-18 DIAGNOSIS — M5440 Lumbago with sciatica, unspecified side: Secondary | ICD-10-CM

## 2017-07-18 NOTE — Addendum Note (Signed)
Addended by: Alysia Penna A on: 07/18/2017 02:02 PM   Modules accepted: Orders

## 2017-07-22 ENCOUNTER — Ambulatory Visit
Admission: RE | Admit: 2017-07-22 | Discharge: 2017-07-22 | Disposition: A | Discharge status: Home / Self Care | Payer: BLUE CROSS/BLUE SHIELD | Source: Ambulatory Visit | Attending: Orthopedic Surgery | Admitting: Orthopedic Surgery

## 2017-07-22 DIAGNOSIS — M25551 Pain in right hip: Secondary | ICD-10-CM

## 2017-07-29 ENCOUNTER — Encounter: Payer: Self-pay | Admitting: Family Medicine

## 2017-07-29 ENCOUNTER — Ambulatory Visit: Payer: BLUE CROSS/BLUE SHIELD | Admitting: Family Medicine

## 2017-07-29 VITALS — BP 110/62 | HR 82 | Temp 98.2°F | Wt 185.4 lb

## 2017-07-29 DIAGNOSIS — Z23 Encounter for immunization: Secondary | ICD-10-CM | POA: Diagnosis not present

## 2017-07-29 DIAGNOSIS — G8929 Other chronic pain: Secondary | ICD-10-CM

## 2017-07-29 DIAGNOSIS — C61 Malignant neoplasm of prostate: Secondary | ICD-10-CM | POA: Diagnosis not present

## 2017-07-29 DIAGNOSIS — F411 Generalized anxiety disorder: Secondary | ICD-10-CM

## 2017-07-29 DIAGNOSIS — R102 Pelvic and perineal pain: Secondary | ICD-10-CM

## 2017-07-29 MED ORDER — LORAZEPAM 1 MG PO TABS: 1.0000 mg | ORAL_TABLET | Freq: Three times a day (TID) | ORAL | 5 refills | Status: DC | PRN

## 2017-07-29 NOTE — Progress Notes (Signed)
   Subjective:    Patient ID: Gabriel Griffith, male    DOB: 02/17/1966, 52 y.o.   MRN: [id number]  HPI Here with his wife to discuss several issues. He has been seeing Dr. Karsten Griffith for an elevated PSA (it was 6.87 a few weeks ago) and on a recent biopsy 4 out of 12 cores were positive for adenocarcinoma. He was referred to see Dr. Raynelle Griffith on 07-31-17 to discuss the possibility of robotic prostatectomy. So far it seems likely that there has been no metastatic spread outside the capsule. However he has also been having low back pain and a non-contrasted MRI of the lumbar spine showed a mass in the L5-S1 disc space. He saw Dr. Sherley Griffith on 07-22-17 and he felt that this is more likely to be a disc fragment than a metastasis. He has ordered a contrasted MRI of the lumbar spine to get a better look at this. Gabriel Griffith and his wife would like to get another perspective and want to see an Oncologist as well. He has had good relief from his anxiety with taking Lorazepam twice a day, and he has discovered that this helps relieve his crampy pelvic pain as well. However he wants to try dosing this TID. He is interested in pelvic floor PT if possible.    Review of Systems  Constitutional: Negative.   Respiratory: Negative.   Cardiovascular: Negative.   Gastrointestinal: Positive for abdominal pain. Negative for abdominal distention, constipation, diarrhea and nausea.  Genitourinary: Negative for difficulty urinating and dysuria.  Neurological: Negative.   Psychiatric/Behavioral: Negative for dysphoric mood. The patient is nervous/anxious.        Objective:   Physical Exam  Constitutional: He is oriented to person, place, and time. He appears well-developed and well-nourished.  Cardiovascular: Normal rate, regular rhythm, normal heart sounds and intact distal pulses.  Pulmonary/Chest: Effort normal and breath sounds normal. No respiratory distress. He has no wheezes. He has no rales.  Neurological: He  is alert and oriented to person, place, and time.  Psychiatric: He has a normal mood and affect. His behavior is normal. Thought content normal.          Assessment & Plan:  He has recently been diagnosed with prostate cancer and he will meet with Dr. Alinda Money in a few days to discuss robotic surgery. At his request I will also refer him to the [place] [facility] to get another opinion about possible therapies. For the pelvic pain I suggested he contact [facility] for an evaluation. For the anxiety we will increase the Lorazepam to TID dosing.  Alysia Penna, MD

## 2017-07-30 ENCOUNTER — Encounter: Payer: Self-pay | Admitting: Family Medicine

## 2017-08-01 ENCOUNTER — Telehealth: Payer: Self-pay

## 2017-08-01 NOTE — Telephone Encounter (Signed)
I don't see any results in the chart. We can check again next week

## 2017-08-01 NOTE — Telephone Encounter (Signed)
Call pt back next week to ask how the lump is on his buttocks after his antibiotic shot

## 2017-08-06 ENCOUNTER — Encounter: Payer: Self-pay | Admitting: Internal Medicine

## 2017-08-06 ENCOUNTER — Ambulatory Visit: Payer: BLUE CROSS/BLUE SHIELD | Admitting: Internal Medicine

## 2017-08-06 VITALS — BP 90/68 | HR 72 | Ht 71.5 in | Wt 188.8 lb

## 2017-08-06 DIAGNOSIS — Z1211 Encounter for screening for malignant neoplasm of colon: Secondary | ICD-10-CM

## 2017-08-06 DIAGNOSIS — R109 Unspecified abdominal pain: Secondary | ICD-10-CM

## 2017-08-06 DIAGNOSIS — R935 Abnormal findings on diagnostic imaging of other abdominal regions, including retroperitoneum: Secondary | ICD-10-CM

## 2017-08-06 DIAGNOSIS — K5790 Diverticulosis of intestine, part unspecified, without perforation or abscess without bleeding: Secondary | ICD-10-CM | POA: Diagnosis not present

## 2017-08-06 MED ORDER — PEG-KCL-NACL-NASULF-NA ASC-C 140 G PO SOLR: 1.0000 | Freq: Once | ORAL | 0 refills | Status: AC

## 2017-08-06 NOTE — Patient Instructions (Signed)

## 2017-08-07 ENCOUNTER — Encounter: Payer: Self-pay | Admitting: Internal Medicine

## 2017-08-07 NOTE — Progress Notes (Signed)
HISTORY OF PRESENT ILLNESS:  Gabriel Griffith is a 52 y.o. male , retired Product/process development scientist, who was recently diagnosed with prostate cancer. He is sent today by his primary care provider Dr. Sarajane Jews with chief complaint of diverticular disease, abdominal cramping, and the need for colon cancer screening. Patient reports that he was having problems with abdominal cramping and left lower quadrant pain. He was felt to possibly have diverticulitis and treated with antibiotic therapy. CT scan of the abdomen and pelvis was obtained 04/22/2017. He was found to have sigmoid diverticulosis without definite pericolonic inflammatory changes though the distal sigmoid colon wall was questionably mildly thickened. Patient also is having problems with buttock pain leg pain and sacral pressure. He was found to have a elevated PSA 6.3. He was evaluated by urology and found to have prostate cancer with Gleason score 7 (4+3). Robotic surgery as planned. He had also had complaints of hip pain for which he was evaluated by orthopedics. MRI was obtained 07/22/2017 and revealed a small tear in the right acetabulum. Incidental sigmoid diverticulosis (extensive) noted. Patient reports that he has had no problems with his abdominal pain in recent weeks. Review of outside laboratories from November 30 reveals mild leukocytosis with white blood cell count 12.0. Normal hemoglobin of 14.7. In October normal basic metabolic panel. Patient currently reports normal bowel habits. No bleeding. No family history of colon cancer. GI review of systems is otherwise negative  REVIEW OF SYSTEMS:  All non-GI ROS negative unless otherwise stated in the history of present illness except for allergies, anxiety, back pain, fatigue, urinary frequency  Past Medical History:  Diagnosis Date  . History of tonsillectomy     Past Surgical History:  Procedure Laterality Date  . LUMBAR LAMINECTOMY  1996  . PAROTID GLAND TUMOR EXCISION    .  TONSILLECTOMY      Social History Gabriel Griffith  reports that he has quit smoking. His smoking use included cigarettes. He has a 35.00 pack-year smoking history. He quit smokeless tobacco use about 3 months ago. He reports that he uses drugs. Drug: Marijuana. He reports that he does not drink alcohol.  family history includes Coronary artery disease in his unknown relative; Hypothyroidism in his mother; Lung cancer in his father and unknown relative.  No Known Allergies     PHYSICAL EXAMINATION: Vital signs: BP 90/68   Pulse 72   Ht 5' 11.5" (1.816 m)   Wt 188 lb 12.8 oz (85.6 kg)   BMI 25.97 kg/m   Constitutional: generally well-appearing, no acute distress Psychiatric: alert and oriented x3, cooperative Eyes: extraocular movements intact, anicteric, conjunctiva pink Mouth: oral pharynx moist, no lesions Neck: supple no lymphadenopathy Cardiovascular: heart regular rate and rhythm, no murmur Lungs: clear to auscultation bilaterally Abdomen: soft, nontender, nondistended, no obvious ascites, no peritoneal signs, normal bowel sounds, no organomegaly Rectal:deferred until colonoscopy Extremities: no clubbing, cyanosis, or lower extremity edema bilaterally Skin: no lesions on visible extremities Neuro: No focal deficits. Cranial nerves intact  ASSESSMENT:  #1. Diverticulosis with possible diverticulitis previously. Resolved clinically and radiographically. #2. Colon cancer screening. Baseline risk. Appropriate candidate without contrast indication #3. Recently diagnosed prostate cancer. Anticipating and robotic surgery   PLAN:  #1.discussion on diverticular disease #2. High-fiber diet #3. Screening colonoscopy.The nature of the procedure, as well as the risks, benefits, and alternatives were carefully and thoroughly reviewed with the patient. Ample time for discussion and questions allowed. The patient understood, was satisfied, and agreed to proceed. #4. Ongoing general  medical care Dr. Sarajane Jews A copy of this consultation note has been sent to Dr. Sarajane Jews

## 2017-08-08 NOTE — Telephone Encounter (Signed)
Called and spoke with pt about an issues he expressed to me last week about a lump on his buttocks area after he was given a shot at another doctors office.  Spoke with pt he stated that the lump is still there and he is worry he might have an abscess.   I told the pt to call that doctors office and let them know about the lump. If they advise the pt to see there PCP we can make him an appt.

## 2017-08-12 ENCOUNTER — Ambulatory Visit (AMBULATORY_SURGERY_CENTER): Payer: BLUE CROSS/BLUE SHIELD | Admitting: Internal Medicine

## 2017-08-12 ENCOUNTER — Encounter: Payer: Self-pay | Admitting: Internal Medicine

## 2017-08-12 ENCOUNTER — Other Ambulatory Visit: Payer: Self-pay

## 2017-08-12 VITALS — BP 91/64 | HR 65 | Temp 99.6°F | Resp 10 | Ht 71.0 in | Wt 188.0 lb

## 2017-08-12 DIAGNOSIS — Z1211 Encounter for screening for malignant neoplasm of colon: Secondary | ICD-10-CM | POA: Diagnosis not present

## 2017-08-12 DIAGNOSIS — D123 Benign neoplasm of transverse colon: Secondary | ICD-10-CM

## 2017-08-12 DIAGNOSIS — Z1212 Encounter for screening for malignant neoplasm of rectum: Secondary | ICD-10-CM | POA: Diagnosis not present

## 2017-08-12 DIAGNOSIS — D125 Benign neoplasm of sigmoid colon: Secondary | ICD-10-CM | POA: Diagnosis not present

## 2017-08-12 HISTORY — PX: COLONOSCOPY: SHX174

## 2017-08-12 MED ORDER — SODIUM CHLORIDE 0.9 % IV SOLN: 500.0000 mL | Freq: Once | INTRAVENOUS | Status: DC

## 2017-08-12 NOTE — Progress Notes (Signed)
Called to room to assist during endoscopic procedure.  Patient ID and intended procedure confirmed with present staff. Received instructions for my participation in the procedure from the performing physician.  

## 2017-08-12 NOTE — Progress Notes (Signed)
Report to PACU, RN, vss, BBS= Clear.  

## 2017-08-12 NOTE — Progress Notes (Signed)
Pt's states no medical or surgical changes since previsit or office visit. 

## 2017-08-12 NOTE — Op Note (Signed)
[facility] Patient Name: Martie Fulgham Procedure Date: 08/12/2017 1:59 PM MRN: [id number] Endoscopist: Docia Chuck. Henrene Pastor , MD Age: 52 Referring MD:  Date of Birth: July 14, 1965 Gender: Male Account #: [id number] Procedure:                Colonoscopy with cold snare polypectomy x 2 Indications:              Screening for colorectal malignant neoplasm Medicines:                Monitored Anesthesia Care Procedure:                Pre-Anesthesia Assessment:                           - Prior to the procedure, a History and Physical                            was performed, and patient medications and                            allergies were reviewed. The patient's tolerance of                            previous anesthesia was also reviewed. The risks                            and benefits of the procedure and the sedation                            options and risks were discussed with the patient.                            All questions were answered, and informed consent                            was obtained. Prior Anticoagulants: The patient has                            taken no previous anticoagulant or antiplatelet                            agents. ASA Grade Assessment: II - A patient with                            mild systemic disease. After reviewing the risks                            and benefits, the patient was deemed in                            satisfactory condition to undergo the procedure.                           After obtaining informed consent, the colonoscope  was passed under direct vision. Throughout the                            procedure, the patient's blood pressure, pulse, and                            oxygen saturations were monitored continuously. The                            Colonoscope was introduced through the anus and                            advanced to the the cecum, identified by      appendiceal orifice and ileocecal valve. The                            ileocecal valve, appendiceal orifice, and rectum                            were photographed. The quality of the bowel                            preparation was excellent. The colonoscopy was                            performed without difficulty. The patient tolerated                            the procedure well. The bowel preparation used was                            SUPREP. Scope In: 2:12:49 PM Scope Out: 2:31:05 PM Scope Withdrawal Time: 0 hours 14 minutes 58 seconds  Total Procedure Duration: 0 hours 18 minutes 16 seconds  Findings:                 Two polyps were found in the sigmoid colon and                            transverse colon. The polyps were 1 to 7 mm in                            size. These polyps were removed with a cold snare.                            Resection and retrieval were complete.                           Multiple diverticula were found in the left colon.                           Internal hemorrhoids were found during  retroflexion. The hemorrhoids were small.                           The exam was otherwise without abnormality on                            direct and retroflexion views. Complications:            No immediate complications. Estimated blood loss:                            None. Estimated Blood Loss:     Estimated blood loss: none. Impression:               - Two 1 to 7 mm polyps in the sigmoid colon and in                            the transverse colon, removed with a cold snare.                            Resected and retrieved.                           - Diverticulosis in the left colon.                           - Internal hemorrhoids.                           - The examination was otherwise normal on direct                            and retroflexion views. Recommendation:           - Repeat colonoscopy in 3 years if polyp  villous                            otherwise 5 years for surveillance.                           - Patient has a contact number available for                            emergencies. The signs and symptoms of potential                            delayed complications were discussed with the                            patient. Return to normal activities tomorrow.                            Written discharge instructions were provided to the                            patient.                           -  Resume previous diet.                           - Continue present medications.                           - Await pathology results. Docia Chuck. Henrene Pastor, MD 08/12/2017 2:36:58 PM This report has been signed electronically.

## 2017-08-12 NOTE — Patient Instructions (Signed)
Handouts given for Polyps, diverticulosis, and hemorrhoids.  YOU HAD AN ENDOSCOPIC PROCEDURE TODAY AT [facility]:   Refer to the procedure report that was given to you for any specific questions about what was found during the examination.  If the procedure report does not answer your questions, please call your gastroenterologist to clarify.  If you requested that your care partner not be given the details of your procedure findings, then the procedure report has been included in a sealed envelope for you to review at your convenience later.  YOU SHOULD EXPECT: Some feelings of bloating in the abdomen. Passage of more gas than usual.  Walking can help get rid of the air that was put into your GI tract during the procedure and reduce the bloating. If you had a lower endoscopy (such as a colonoscopy or flexible sigmoidoscopy) you may notice spotting of blood in your stool or on the toilet paper. If you underwent a bowel prep for your procedure, you may not have a normal bowel movement for a few days.  Please Note:  You might notice some irritation and congestion in your nose or some drainage.  This is from the oxygen used during your procedure.  There is no need for concern and it should clear up in a day or so.  SYMPTOMS TO REPORT IMMEDIATELY:   Following lower endoscopy (colonoscopy or flexible sigmoidoscopy):  Excessive amounts of blood in the stool  Significant tenderness or worsening of abdominal pains  Swelling of the abdomen that is new, acute  Fever of 100F or higher   Following upper endoscopy (EGD)  Vomiting of blood or coffee ground material  New chest pain or pain under the shoulder blades  Painful or persistently difficult swallowing  New shortness of breath  Fever of 100F or higher  Black, tarry-looking stools  For urgent or emergent issues, a gastroenterologist can be reached at any hour by calling [phone].   DIET:  We do recommend a small meal at  first, but then you may proceed to your regular diet.  Drink plenty of fluids but you should avoid alcoholic beverages for 24 hours.  ACTIVITY:  You should plan to take it easy for the rest of today and you should NOT DRIVE or use heavy machinery until tomorrow (because of the sedation medicines used during the test).    FOLLOW UP: Our staff will call the number listed on your records the next business day following your procedure to check on you and address any questions or concerns that you may have regarding the information given to you following your procedure. If we do not reach you, we will leave a message.  However, if you are feeling well and you are not experiencing any problems, there is no need to return our call.  We will assume that you have returned to your regular daily activities without incident.  If any biopsies were taken you will be contacted by phone or by letter within the next 1-3 weeks.  Please call us at [phone] if you have not heard about the biopsies in 3 weeks.    SIGNATURES/CONFIDENTIALITY: You and/or your care partner have signed paperwork which will be entered into your electronic medical record.  These signatures attest to the fact that that the information above on your After Visit Summary has been reviewed and is understood.  Full responsibility of the confidentiality of this discharge information lies with you and/or your care-partner.YOU HAD AN ENDOSCOPIC PROCEDURE  TODAY AT [facility]:   Refer to the procedure report that was given to you for any specific questions about what was found during the examination.  If the procedure report does not answer your questions, please call your gastroenterologist to clarify.  If you requested that your care partner not be given the details of your procedure findings, then the procedure report has been included in a sealed envelope for you to review at your convenience later.  YOU SHOULD EXPECT: Some  feelings of bloating in the abdomen. Passage of more gas than usual.  Walking can help get rid of the air that was put into your GI tract during the procedure and reduce the bloating. If you had a lower endoscopy (such as a colonoscopy or flexible sigmoidoscopy) you may notice spotting of blood in your stool or on the toilet paper. If you underwent a bowel prep for your procedure, you may not have a normal bowel movement for a few days.  Please Note:  You might notice some irritation and congestion in your nose or some drainage.  This is from the oxygen used during your procedure.  There is no need for concern and it should clear up in a day or so.  SYMPTOMS TO REPORT IMMEDIATELY:   Following lower endoscopy (colonoscopy or flexible sigmoidoscopy):  Excessive amounts of blood in the stool  Significant tenderness or worsening of abdominal pains  Swelling of the abdomen that is new, acute  Fever of 100F or higher   For urgent or emergent issues, a gastroenterologist can be reached at any hour by calling [phone].   DIET:  We do recommend a small meal at first, but then you may proceed to your regular diet.  Drink plenty of fluids but you should avoid alcoholic beverages for 24 hours.  ACTIVITY:  You should plan to take it easy for the rest of today and you should NOT DRIVE or use heavy machinery until tomorrow (because of the sedation medicines used during the test).    FOLLOW UP: Our staff will call the number listed on your records the next business day following your procedure to check on you and address any questions or concerns that you may have regarding the information given to you following your procedure. If we do not reach you, we will leave a message.  However, if you are feeling well and you are not experiencing any problems, there is no need to return our call.  We will assume that you have returned to your regular daily activities without incident.  If any biopsies were  taken you will be contacted by phone or by letter within the next 1-3 weeks.  Please call us at [phone] if you have not heard about the biopsies in 3 weeks.    SIGNATURES/CONFIDENTIALITY: You and/or your care partner have signed paperwork which will be entered into your electronic medical record.  These signatures attest to the fact that that the information above on your After Visit Summary has been reviewed and is understood.  Full responsibility of the confidentiality of this discharge information lies with you and/or your care-partner.

## 2017-08-13 ENCOUNTER — Telehealth: Payer: Self-pay

## 2017-08-13 NOTE — Telephone Encounter (Signed)
  Follow up Call-  Call back number 08/12/2017  Post procedure Call Back phone  # [phone]  Permission to leave phone message Yes  Some recent data might be hidden     Patient questions:  Do you have a fever, pain , or abdominal swelling? No. Pain Score  0 *  Have you tolerated food without any problems? Yes.    Have you been able to return to your normal activities? Yes.    Do you have any questions about your discharge instructions: Diet   No. Medications  No. Follow up visit  No.  Do you have questions or concerns about your Care? No.  Actions: * If pain score is 4 or above: No action needed, pain <4.

## 2017-08-18 ENCOUNTER — Telehealth: Payer: Self-pay | Admitting: Family Medicine

## 2017-08-18 NOTE — Telephone Encounter (Signed)
Copied from [place] #[id number]. Topic: Quick Communication - See Telephone Encounter >> Aug 18, 2017  4:43 PM Aurelio Brash B wrote: CRM for notification. See Telephone encounter for:  PT states Dr Sarajane Jews changed his medication amount from 2 a day to 3 a day,  now he needs a refill but pharmacy is telling him its not time for refill.   LORazepam (ATIVAN) 1 MG tablet  [id number] [address], [place] - [place] [phone] (Phone) [phone] (Fax)    08/18/17.

## 2017-08-18 NOTE — Telephone Encounter (Signed)
I sent in a rx for #90 with refills on 07-29-17. I don't understand what the problem is

## 2017-08-19 ENCOUNTER — Encounter (HOSPITAL_COMMUNITY): Payer: Self-pay | Admitting: *Deleted

## 2017-08-19 ENCOUNTER — Other Ambulatory Visit: Payer: Self-pay | Admitting: Urology

## 2017-08-19 NOTE — Telephone Encounter (Signed)
Called and spoke to pt's wife and advised that he should have plenty of refills at his pharmacy. Pt's wife voiced understanding.

## 2017-08-20 ENCOUNTER — Encounter: Payer: Self-pay | Admitting: Internal Medicine

## 2017-08-28 ENCOUNTER — Telehealth: Payer: Self-pay | Admitting: Family Medicine

## 2017-08-28 NOTE — Telephone Encounter (Signed)
Copied from [place]. Topic: Quick Communication - Rx Refill/Question >> Aug 28, 2017 10:39 AM Gabriel Griffith wrote: Medication: LORazepam (ATIVAN) 1 MG tablet [[id number]]    Has the patient contacted their pharmacy? Yes.     [facility]  Pt states that he is running out of the Rx, pt states the directions is for him to take 2 a day as needed but I see the directions are different, as well the pharmacist is stating the directions need clarification for them, contact pt and pharmacy for resolution

## 2017-08-28 NOTE — Telephone Encounter (Signed)
Called pharmacy they stated that the pt did pick up an Rx last week.  Called pt he stated that the last Rx he picked up he was only given a disp amount of 60 and NOT 90 and the direction were twice daily and not tid. Pt stated that he did pick an Rx last week but they only gave him 30 pills. Pt will be w/o medication by Saturday.

## 2017-08-28 NOTE — Telephone Encounter (Signed)
Last OV 07/29/2017  Last refilled 07/29/2017 disp 90 with 5 refills directions are take 1 tablet every 8 hours as needed.   Will need to call pharmacy

## 2017-09-01 NOTE — Telephone Encounter (Signed)
Called and spoke with pt he stated that this situation has been handled at the pharmacy and a few days ago he did pick up a 90 day supply with the correct directions. Called the pharmacy as well and they stated that the pt did pick up the Rx with correct dips amount and directions. Issues has been resolved.

## 2017-09-05 ENCOUNTER — Encounter (HOSPITAL_COMMUNITY): Payer: Self-pay

## 2017-09-05 NOTE — Pre-Procedure Instructions (Signed)
The following are in epic:   EKG 04/19/2017 Last office visit note Dr. Henrene Pastor 08/06/2017 and Dr. Sarajane Jews 06/20/2017

## 2017-09-05 NOTE — Patient Instructions (Signed)
Your procedure is scheduled on: Thursday, September 18, 2017   Surgery Time:  7:15AM-10:45AM   Report to [facility] by 5:30 AM pick up the phone at the front desk dial [phone], have a seat in the lobby and a staff member will come and escort you to Short Stay Department   Call this number if you have problems the morning of surgery [phone]   8 OZ OF MAGNESIUM CITRATE BY NOON DAY BEFORE SURGERY   Fleet enema NIGHT BEFORE SURGERY   Do not eat food or drink liquids :After Midnight.   Do NOT smoke after Midnight   Take these medicines the morning of surgery with A SIP OF WATER: Lorazepam if needed                               You may not have any metal on your body including jewelry, and body piercings             Do not wear lotions, powders, perfumes/cologne, or deodorant                          Men may shave face and neck.   Do not bring valuables to the hospital. [place].   Contacts, dentures or bridgework may not be worn into surgery.   Leave suitcase in the car. After surgery it may be brought to your room.   Special Instructions: Bring a copy of your healthcare power of attorney and living will documents         the day of surgery if you haven't scanned them in before.              Please read over the following fact sheets you were given:  [facility] - Preparing for Surgery Before surgery, you can play an important role.  Because skin is not sterile, your skin needs to be as free of germs as possible.  You can reduce the number of germs on your skin by washing with CHG (chlorahexidine gluconate) soap before surgery.  CHG is an antiseptic cleaner which kills germs and bonds with the skin to continue killing germs even after washing. Please DO NOT use if you have an allergy to CHG or antibacterial soaps.  If your skin becomes reddened/irritated stop using the CHG and inform your  nurse when you arrive at Short Stay. Do not shave (including legs and underarms) for at least 48 hours prior to the first CHG shower.  You may shave your face/neck.  Please follow these instructions carefully:  1.  Shower with CHG Soap the night before surgery and the  morning of surgery.  2.  If you choose to wash your hair, wash your hair first as usual with your normal  shampoo.  3.  After you shampoo, rinse your hair and body thoroughly to remove the shampoo.                             4.  Use CHG as you would any other liquid soap.  You can apply chg directly to the skin and wash.  Gently with a scrungie or clean washcloth.  5.  Apply the CHG Soap to  your body ONLY FROM THE NECK DOWN.   Do   not use on face/ open                           Wound or open sores. Avoid contact with eyes, ears mouth and   genitals (private parts).                       Wash face,  Genitals (private parts) with your normal soap.             6.  Wash thoroughly, paying special attention to the area where your    surgery  will be performed.  7.  Thoroughly rinse your body with warm water from the neck down.  8.  DO NOT shower/wash with your normal soap after using and rinsing off the CHG Soap.                9.  Pat yourself dry with a clean towel.            10.  Wear clean pajamas.            11.  Place clean sheets on your bed the night of your first shower and do not  sleep with pets. Day of Surgery : Do not apply any lotions/deodorants the morning of surgery.  Please wear clean clothes to the hospital/surgery center.  FAILURE TO FOLLOW THESE INSTRUCTIONS MAY RESULT IN THE CANCELLATION OF YOUR SURGERY  PATIENT SIGNATURE_________________________________  NURSE SIGNATURE__________________________________  ________________________________________________________________________   Adam Phenix  An incentive spirometer is a tool that can help keep your lungs clear and active. This tool measures  how well you are filling your lungs with each breath. Taking long deep breaths may help reverse or decrease the chance of developing breathing (pulmonary) problems (especially infection) following:  A long period of time when you are unable to move or be active. BEFORE THE PROCEDURE   If the spirometer includes an indicator to show your best effort, your nurse or respiratory therapist will set it to a desired goal.  If possible, sit up straight or lean slightly forward. Try not to slouch.  Hold the incentive spirometer in an upright position. INSTRUCTIONS FOR USE  1. Sit on the edge of your bed if possible, or sit up as far as you can in bed or on a chair. 2. Hold the incentive spirometer in an upright position. 3. Breathe out normally. 4. Place the mouthpiece in your mouth and seal your lips tightly around it. 5. Breathe in slowly and as deeply as possible, raising the piston or the ball toward the top of the column. 6. Hold your breath for 3-5 seconds or for as long as possible. Allow the piston or ball to fall to the bottom of the column. 7. Remove the mouthpiece from your mouth and breathe out normally. 8. Rest for a few seconds and repeat Steps 1 through 7 at least 10 times every 1-2 hours when you are awake. Take your time and take a few normal breaths between deep breaths. 9. The spirometer may include an indicator to show your best effort. Use the indicator as a goal to work toward during each repetition. 10. After each set of 10 deep breaths, practice coughing to be sure your lungs are clear. If you have an incision (the cut made at the time of surgery), support your incision when coughing by placing a  pillow or rolled up towels firmly against it. Once you are able to get out of bed, walk around indoors and cough well. You may stop using the incentive spirometer when instructed by your caregiver.  RISKS AND COMPLICATIONS  Take your time so you do not get dizzy or light-headed.  If  you are in pain, you may need to take or ask for pain medication before doing incentive spirometry. It is harder to take a deep breath if you are having pain. AFTER USE  Rest and breathe slowly and easily.  It can be helpful to keep track of a log of your progress. Your caregiver can provide you with a simple table to help with this. If you are using the spirometer at home, follow these instructions: [place] IF:   You are having difficultly using the spirometer.  You have trouble using the spirometer as often as instructed.  Your pain medication is not giving enough relief while using the spirometer.  You develop fever of 100.5 F (38.1 C) or higher. SEEK IMMEDIATE MEDICAL CARE IF:   You cough up bloody sputum that had not been present before.  You develop fever of 102 F (38.9 C) or greater.  You develop worsening pain at or near the incision site. MAKE SURE YOU:   Understand these instructions.  Will watch your condition.  Will get help right away if you are not doing well or get worse. Document Released: 11/04/2006 Document Revised: 09/16/2011 Document Reviewed: 01/05/2007 ExitCare Patient Information 2014 Exit[employer], [place].   ________________________________________________________________________  WHAT IS A BLOOD TRANSFUSION? Blood Transfusion Information  A transfusion is the replacement of blood or some of its parts. Blood is made up of multiple cells which provide different functions.  Red blood cells carry oxygen and are used for blood loss replacement.  White blood cells fight against infection.  Platelets control bleeding.  Plasma helps clot blood.  Other blood products are available for specialized needs, such as hemophilia or other clotting disorders. BEFORE THE TRANSFUSION  Who gives blood for transfusions?   Healthy volunteers who are fully evaluated to make sure their blood is safe. This is blood bank blood. Transfusion therapy is the  safest it has ever been in the practice of medicine. Before blood is taken from a donor, a complete history is taken to make sure that person has no history of diseases nor engages in risky social behavior (examples are intravenous drug use or sexual activity with multiple partners). The donor's travel history is screened to minimize risk of transmitting infections, such as malaria. The donated blood is tested for signs of infectious diseases, such as HIV and hepatitis. The blood is then tested to be sure it is compatible with you in order to minimize the chance of a transfusion reaction. If you or a relative donates blood, this is often done in anticipation of surgery and is not appropriate for emergency situations. It takes many days to process the donated blood. RISKS AND COMPLICATIONS Although transfusion therapy is very safe and saves many lives, the main dangers of transfusion include:   Getting an infectious disease.  Developing a transfusion reaction. This is an allergic reaction to something in the blood you were given. Every precaution is taken to prevent this. The decision to have a blood transfusion has been considered carefully by your caregiver before blood is given. Blood is not given unless the benefits outweigh the risks. AFTER THE TRANSFUSION  Right after receiving a blood transfusion, you  will usually feel much better and more energetic. This is especially true if your red blood cells have gotten low (anemic). The transfusion raises the level of the red blood cells which carry oxygen, and this usually causes an energy increase.  The nurse administering the transfusion will monitor you carefully for complications. HOME CARE INSTRUCTIONS  No special instructions are needed after a transfusion. You may find your energy is better. Speak with your caregiver about any limitations on activity for underlying diseases you may have. SEEK MEDICAL CARE IF:   Your condition is not improving  after your transfusion.  You develop redness or irritation at the intravenous (IV) site. SEEK IMMEDIATE MEDICAL CARE IF:  Any of the following symptoms occur over the next 12 hours:  Shaking chills.  You have a temperature by mouth above 102 F (38.9 C), not controlled by medicine.  Chest, back, or muscle pain.  People around you feel you are not acting correctly or are confused.  Shortness of breath or difficulty breathing.  Dizziness and fainting.  You get a rash or develop hives.  You have a decrease in urine output.  Your urine turns a dark color or changes to pink, red, or brown. Any of the following symptoms occur over the next 10 days:  You have a temperature by mouth above 102 F (38.9 C), not controlled by medicine.  Shortness of breath.  Weakness after normal activity.  The white part of the eye turns yellow (jaundice).  You have a decrease in the amount of urine or are urinating less often.  Your urine turns a dark color or changes to pink, red, or brown. Document Released: 06/21/2000 Document Revised: 09/16/2011 Document Reviewed: 02/08/2008 [facility] Patient Information 2014 [place], [place].  _______________________________________________________________________

## 2017-09-09 ENCOUNTER — Other Ambulatory Visit: Payer: Self-pay

## 2017-09-09 ENCOUNTER — Encounter (HOSPITAL_COMMUNITY)
Admission: RE | Admit: 2017-09-09 | Discharge: 2017-09-09 | Disposition: A | Discharge status: Home / Self Care | Payer: BLUE CROSS/BLUE SHIELD | Source: Ambulatory Visit | Attending: Urology | Admitting: Urology

## 2017-09-09 ENCOUNTER — Encounter (HOSPITAL_COMMUNITY): Payer: Self-pay

## 2017-09-09 DIAGNOSIS — Z01812 Encounter for preprocedural laboratory examination: Secondary | ICD-10-CM | POA: Insufficient documentation

## 2017-09-09 HISTORY — DX: Personal history of other endocrine, nutritional and metabolic disease: Z86.39

## 2017-09-09 HISTORY — DX: Malignant neoplasm of prostate: C61

## 2017-09-09 HISTORY — DX: Other chronic pain: G89.29

## 2017-09-09 HISTORY — DX: Personal history of other diseases of the respiratory system: Z87.09

## 2017-09-09 HISTORY — DX: Osteoarthritis of hip, unspecified: M16.9

## 2017-09-09 HISTORY — DX: Diverticulosis of large intestine without perforation or abscess without bleeding: K57.30

## 2017-09-09 HISTORY — DX: Adverse effect of unspecified anesthetic, initial encounter: T41.45XA

## 2017-09-09 HISTORY — DX: Anxiety disorder, unspecified: F41.9

## 2017-09-09 HISTORY — DX: Benign neoplasm of parotid gland: D11.0

## 2017-09-09 HISTORY — DX: Sciatica, unspecified side: M54.30

## 2017-09-09 HISTORY — DX: Deviated nasal septum: J34.2

## 2017-09-09 HISTORY — DX: Other complications of anesthesia, initial encounter: T88.59XA

## 2017-09-09 HISTORY — DX: Unspecified thoracic, thoracolumbar and lumbosacral intervertebral disc disorder: M51.9

## 2017-09-09 HISTORY — DX: Pelvic and perineal pain unspecified side: R10.20

## 2017-09-09 HISTORY — DX: Pelvic and perineal pain: R10.2

## 2017-09-09 LAB — CBC
HEMATOCRIT: 45.7 % (ref 39.0–52.0)
Hemoglobin: 15.6 g/dL (ref 13.0–17.0)
MCH: 32.9 pg (ref 26.0–34.0)
MCHC: 34.1 g/dL (ref 30.0–36.0)
MCV: 96.4 fL (ref 78.0–100.0)
PLATELETS: 259 10*3/uL (ref 150–400)
RBC: 4.74 MIL/uL (ref 4.22–5.81)
RDW: 13.6 % (ref 11.5–15.5)
WBC: 9.3 10*3/uL (ref 4.0–10.5)

## 2017-09-09 LAB — BASIC METABOLIC PANEL
Anion gap: 8 (ref 5–15)
BUN: 15 mg/dL (ref 6–20)
CO2: 25 mmol/L (ref 22–32)
CREATININE: 1.07 mg/dL (ref 0.61–1.24)
Calcium: 9 mg/dL (ref 8.9–10.3)
Chloride: 107 mmol/L (ref 101–111)
GFR calc Af Amer: >60 mL/min (ref >60)
Glucose, Bld: 88 mg/dL (ref 65–99)
POTASSIUM: 4.7 mmol/L (ref 3.5–5.1)
SODIUM: 140 mmol/L (ref 135–145)

## 2017-09-09 MED ORDER — MAGNESIUM CITRATE PO SOLN
1.0000 | Freq: Once | ORAL | Status: DC
  Filled 2017-09-09: qty 296

## 2017-09-09 MED ORDER — FLEET ENEMA 7-19 GM/118ML RE ENEM
1.0000 | ENEMA | Freq: Once | RECTAL | Status: DC
  Filled 2017-09-09: qty 1

## 2017-09-10 LAB — ABO/RH: ABO/RH(D): O POS

## 2017-09-17 NOTE — H&P (Signed)
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   He also recently underwent an MRI of his spine and was noted to have a 13 mm left sided soft tissue mass of the spinal canal at L5-S1.   He has urologic history of chronic prostatitis, chronic pelvic pain, hematuria (s/p negative evaluation by Dr. Rosana Hoes in 2010).   Family history: None.   Imaging studies: None.   PMH: He has a history of hyperlipidemia and chronic pelvic pain (since 2009).  PSH: No abdominal surgeries.   TNM stage: cT1c Nx Mx  PSA: 6.37  Gleason score: 4+3=7  Biopsy (07/10/17): 4/12 cores positive  Left: L lateral apex (60%, 3+3=6, PNI), L lateral mid (5%, 3+3=6), L lateral base (20%, 4+3=7)  Right: R lateral base (30%, 4+3=7)  Prostate volume: 56.4 cc   Nomogram  OC disease: 39%  EPE: 58%  SVI: 6%  LNI: 7%  PFS (5 year, 10 year): 69%, 54%   Urinary function: IPSS is 15.  Erectile function: SHIM score is 25. He and his wife are very sexually active and participate in intercourse on a daily basis.     ALLERGIES: No Allergies    MEDICATIONS: Lorazepam     GU PSH: Prostate Needle Biopsy - 07/10/2017      PSH Notes: Tonsillectomy, Back Surgery   NON-GU PSH: Back surgery, rupture disk Remove Tonsils - 2010 Surgical Pathology, Gross And Microscopic Examination For Prostate Needle - 07/10/2017    GU PMH: Prostate Cancer, Am going to schedule him for an appointment to discuss robotic radical prostatectomy and the meantime he is going to undergo the MRI scan of his hip and have the lesion in his spinal canal addressed by a neurosurgeon who he is yet to meet with. - 07/21/2017 BPH w/o LUTS, He does have BPH by exam but does not have any significant obstructive symptoms. No abnormality of the  prostate was noted on his recent CT scan of the pelvis. - 05/22/2017 Elevated PSA, He has a significantly elevated PSA for his age. I am going to repeat that to make sure that it is not a spurious value and I told him that if his PSA remains elevated my recommendation would be to proceed with a prostate biopsy. - 05/22/2017, Elevated prostate specific antigen (PSA), - 2014 Chronic prostatitis, Prostatitis, chronic - 2014 ED due to arterial insufficiency, Erectile dysfunction due to arterial insufficiency - 2014 Lower abdominal pain, unspecified, Lower abdominal pain - 2014      PMH Notes: Gross hematuria: He was seen and evaluated by Dr. Rosana Hoes for this in 6/10. He underwent cystoscopy but no upper tract evaluation. It was felt at that time that his hematuria was from prostatitis.   Chronic prostatitis: The patient has had a history of prostatitis in the past.   Elevated PSA: He was found to have a PSA of 4.41/20.6% in 4/10 by Dr. Rosana Hoes. His feeling was that this was due to his prostatitis. It was recommended he have a repeat PSA but the patient failed to return.   Pelvic pain: He has had what he describes as chronic pelvic pain since late 2009. The distribution is in the suprapubic region in a "smile-shaped" distribution. It seems to be improved by laying down.  He finds that is almost absolutely associated with stress. He can actively relax his lower abdominal musculature as well as pelvic muscles and achieve relief but as soon as he is preoccupied with something else he finds the pain has recurred. He had a migraine headache and his mother gave him one of her Percocet and he found that that helped he said he did not want to continue to take this kind of medicine because of the side effects.   Erectile dysfunction: He also mentioned that in the past he had some difficulty with erectile dysfunction and Dr. Rosana Hoes had prescribed Viagra which was helpful.     NON-GU PMH: Muscle weakness (generalized),  Muscle weakness - 2014 Other lack of coordination, Other lack of coordination - 2014 Other muscle spasm, Muscle spasm - 2014 Personal history of other endocrine, nutritional and metabolic disease, History of hypercholesterolemia - 2014 Anxiety Encounter for general adult medical examination without abnormal findings, Encounter for preventive health examination    FAMILY HISTORY: Family Health Status Number - Runs In Family Heart Disease - Father Hodgkin Disease - Brother   SOCIAL HISTORY: Marital Status: Married Preferred Language: English; Ethnicity: Not Hispanic Or Latino; Race: White     Notes: Activities Of Daily Living, Self-reliant In Usual Daily Activities, Living Independently With Spouse, Exercise Habits, Using Marijuana, Occupation:, Alcohol Use, Marital History - Currently Married, Tobacco Use, Caffeine Use   REVIEW OF SYSTEMS:    GU Review Male:   Patient reports burning/ pain with urination and get up at night to urinate. Patient denies frequent urination, hard to postpone urination, leakage of urine, stream starts and stops, trouble starting your streams, and have to strain to urinate .  Gastrointestinal (Upper):   Patient denies nausea and vomiting.  Gastrointestinal (Lower):   Patient denies diarrhea and constipation.  Constitutional:   Patient reports fatigue. Patient denies fever, night sweats, and weight loss.  Skin:   Patient denies skin rash/ lesion and itching.  Eyes:   Patient denies blurred vision and double vision.  Ears/ Nose/ Throat:   Patient denies sore throat and sinus problems.  Hematologic/Lymphatic:   Patient denies swollen glands and easy bruising.  Cardiovascular:   Patient denies leg swelling and chest pains.  Respiratory:   Patient denies shortness of breath and cough.  Endocrine:   Patient denies excessive thirst.  Musculoskeletal:   Patient reports back pain. Patient denies joint pain.  Neurological:   Patient denies headaches and dizziness.   Psychologic:   Patient reports depression and anxiety.    VITAL SIGNS:      Weight 186 lb / 84.37 kg  Height 72 in / 182.88 cm  BMI 25.2 kg/m     MULTI-SYSTEM PHYSICAL EXAMINATION:    Constitutional: Well-nourished. No physical deformities. Normally developed. Good grooming.  Neck: Neck symmetrical, not swollen. Normal tracheal position.  Respiratory: No labored breathing, no use of accessory muscles. Clear bilaterally.  Cardiovascular: Normal temperature, normal extremity pulses, no swelling, no varicosities. Regular rate and rhythm.  Lymphatic: No enlargement of neck, axillae, groin.  Skin: No paleness, no jaundice, no cyanosis. No lesion, no ulcer, no rash.  Neurologic / Psychiatric: Oriented to time, oriented to place, oriented to person. No depression, no anxiety, no agitation.  Gastrointestinal: No mass, no tenderness, no rigidity, non obese abdomen.  Eyes: Normal conjunctivae. Normal eyelids.  Ears, Nose, Mouth, and Throat: Left ear no scars, no lesions, no masses. Right ear no scars, no lesions, no masses. Nose no scars, no lesions,  no masses. Normal hearing. Normal lips.  Musculoskeletal: Normal gait and station of head and neck.       PLAN:            1. Prostate cancer:  He does adamantly wish to proceed with surgical therapy. He will undergo a bilateral nerve sparing robot-assisted laparoscopic radical prostatectomy and bilateral pelvic lymphadenectomy.

## 2017-09-17 NOTE — Anesthesia Preprocedure Evaluation (Addendum)
Anesthesia Evaluation  Patient identified by MRN, date of birth, ID band Patient awake    Reviewed: Allergy & Precautions, NPO status , Patient's Chart, lab work & pertinent test results  Airway Mallampati: II  TM Distance: >3 FB Neck ROM: Full   Comment: Full beard Dental  (+) Dental Advisory Given, Teeth Intact   Pulmonary former smoker,    Pulmonary exam normal breath sounds clear to auscultation       Cardiovascular Normal cardiovascular exam Rhythm:Regular Rate:Normal     Neuro/Psych  Headaches, Anxiety Left-sided sciatica    GI/Hepatic negative GI ROS, Neg liver ROS,   Endo/Other  negative endocrine ROS  Renal/GU negative Renal ROS   Prostate cancer    Musculoskeletal  (+) Arthritis ,   Abdominal   Peds  Hematology negative hematology ROS (+)   Anesthesia Other Findings Nasal septum deviation  Reproductive/Obstetrics                            Anesthesia Physical Anesthesia Plan  ASA: II  Anesthesia Plan: General   Post-op Pain Management:    Induction: Intravenous  PONV Risk Score and Plan: 4 or greater and Treatment may vary due to age or medical condition, Ondansetron, Dexamethasone, Midazolam and Scopolamine patch - Pre-op  Airway Management Planned: Oral ETT  Additional Equipment: None  Intra-op Plan:   Post-operative Plan: Extubation in OR  Informed Consent: I have reviewed the patients History and Physical, chart, labs and discussed the procedure including the risks, benefits and alternatives for the proposed anesthesia with the patient or authorized representative who has indicated his/her understanding and acceptance.   Dental advisory given  Plan Discussed with: CRNA  Anesthesia Plan Comments:         Anesthesia Quick Evaluation

## 2017-09-18 ENCOUNTER — Ambulatory Visit (HOSPITAL_COMMUNITY): Payer: BLUE CROSS/BLUE SHIELD

## 2017-09-18 ENCOUNTER — Encounter (HOSPITAL_COMMUNITY)
Admission: RE | Disposition: A | Discharge status: Home / Self Care | Payer: Self-pay | Source: Ambulatory Visit | Attending: Urology

## 2017-09-18 ENCOUNTER — Observation Stay (HOSPITAL_COMMUNITY)
Admission: RE | Admit: 2017-09-18 | Discharge: 2017-09-19 | Disposition: A | Discharge status: Home / Self Care | Payer: BLUE CROSS/BLUE SHIELD | Source: Ambulatory Visit | Attending: Urology | Admitting: Urology

## 2017-09-18 ENCOUNTER — Encounter (HOSPITAL_COMMUNITY): Payer: Self-pay | Admitting: *Deleted

## 2017-09-18 DIAGNOSIS — Z79899 Other long term (current) drug therapy: Secondary | ICD-10-CM | POA: Insufficient documentation

## 2017-09-18 DIAGNOSIS — Z87891 Personal history of nicotine dependence: Secondary | ICD-10-CM | POA: Insufficient documentation

## 2017-09-18 DIAGNOSIS — C61 Malignant neoplasm of prostate: Secondary | ICD-10-CM | POA: Diagnosis not present

## 2017-09-18 DIAGNOSIS — F419 Anxiety disorder, unspecified: Secondary | ICD-10-CM | POA: Diagnosis not present

## 2017-09-18 HISTORY — PX: ROBOT ASSISTED LAPAROSCOPIC RADICAL PROSTATECTOMY: SHX5141

## 2017-09-18 HISTORY — PX: LYMPHADENECTOMY: SHX5960

## 2017-09-18 LAB — TYPE AND SCREEN
ABO/RH(D): O POS
ANTIBODY SCREEN: NEGATIVE

## 2017-09-18 LAB — HEMOGLOBIN AND HEMATOCRIT, BLOOD
HCT: 41.4 % (ref 39.0–52.0)
HEMOGLOBIN: 14.2 g/dL (ref 13.0–17.0)

## 2017-09-18 SURGERY — XI ROBOTIC ASSISTED LAPAROSCOPIC RADICAL PROSTATECTOMY LEVEL 2
Anesthesia: General

## 2017-09-18 MED ORDER — PROPOFOL 10 MG/ML IV BOLUS
INTRAVENOUS | Status: DC | PRN
  Administered 2017-09-18: 200 mg via INTRAVENOUS

## 2017-09-18 MED ORDER — SCOPOLAMINE 1 MG/3DAYS TD PT72
MEDICATED_PATCH | TRANSDERMAL | Status: DC | PRN
  Administered 2017-09-18: 1 via TRANSDERMAL

## 2017-09-18 MED ORDER — SULFAMETHOXAZOLE-TRIMETHOPRIM 800-160 MG PO TABS: 1.0000 | ORAL_TABLET | Freq: Two times a day (BID) | ORAL | 0 refills | Status: DC

## 2017-09-18 MED ORDER — BUPIVACAINE HCL (PF) 0.25 % IJ SOLN
INTRAMUSCULAR | Status: AC
  Filled 2017-09-18: qty 30

## 2017-09-18 MED ORDER — DEXAMETHASONE SODIUM PHOSPHATE 10 MG/ML IJ SOLN
INTRAMUSCULAR | Status: DC | PRN
  Administered 2017-09-18: 10 mg via INTRAVENOUS

## 2017-09-18 MED ORDER — SODIUM CHLORIDE 0.9 % IV BOLUS (SEPSIS)
1000.0000 mL | Freq: Once | INTRAVENOUS | Status: AC
  Administered 2017-09-18: 1000 mL via INTRAVENOUS

## 2017-09-18 MED ORDER — HYDROCODONE-ACETAMINOPHEN 5-325 MG PO TABS: 1.0000 | ORAL_TABLET | Freq: Four times a day (QID) | ORAL | 0 refills | Status: DC | PRN

## 2017-09-18 MED ORDER — LACTATED RINGERS IV SOLN
INTRAVENOUS | Status: DC | PRN
  Administered 2017-09-18: 06:00:00 via INTRAVENOUS

## 2017-09-18 MED ORDER — SUCCINYLCHOLINE CHLORIDE 200 MG/10ML IV SOSY
PREFILLED_SYRINGE | INTRAVENOUS | Status: DC | PRN
  Administered 2017-09-18: 120 mg via INTRAVENOUS

## 2017-09-18 MED ORDER — HYDROMORPHONE HCL 1 MG/ML IJ SOLN
INTRAMUSCULAR | Status: DC | PRN
  Administered 2017-09-18: 1 mg via INTRAVENOUS

## 2017-09-18 MED ORDER — DEXAMETHASONE SODIUM PHOSPHATE 10 MG/ML IJ SOLN
INTRAMUSCULAR | Status: AC
  Filled 2017-09-18: qty 2

## 2017-09-18 MED ORDER — ROCURONIUM BROMIDE 10 MG/ML (PF) SYRINGE
PREFILLED_SYRINGE | INTRAVENOUS | Status: AC
  Filled 2017-09-18: qty 10

## 2017-09-18 MED ORDER — KCL IN DEXTROSE-NACL 20-5-0.45 MEQ/L-%-% IV SOLN
INTRAVENOUS | Status: DC
  Administered 2017-09-18 – 2017-09-19 (×3): via INTRAVENOUS
  Filled 2017-09-18 (×3): qty 1000

## 2017-09-18 MED ORDER — FENTANYL CITRATE (PF) 100 MCG/2ML IJ SOLN
INTRAMUSCULAR | Status: DC | PRN
  Administered 2017-09-18: 100 ug via INTRAVENOUS
  Administered 2017-09-18 (×3): 50 ug via INTRAVENOUS

## 2017-09-18 MED ORDER — DIPHENHYDRAMINE HCL 50 MG/ML IJ SOLN: 12.5000 mg | Freq: Four times a day (QID) | INTRAMUSCULAR | Status: DC | PRN

## 2017-09-18 MED ORDER — ONDANSETRON HCL 4 MG/2ML IJ SOLN
INTRAMUSCULAR | Status: AC
  Filled 2017-09-18: qty 4

## 2017-09-18 MED ORDER — EPHEDRINE SULFATE-NACL 50-0.9 MG/10ML-% IV SOSY
PREFILLED_SYRINGE | INTRAVENOUS | Status: DC | PRN
  Administered 2017-09-18: 10 mg via INTRAVENOUS
  Administered 2017-09-18: 5 mg via INTRAVENOUS

## 2017-09-18 MED ORDER — MORPHINE SULFATE (PF) 4 MG/ML IV SOLN
2.0000 mg | INTRAVENOUS | Status: DC | PRN
  Administered 2017-09-18: 4 mg via INTRAVENOUS

## 2017-09-18 MED ORDER — SUGAMMADEX SODIUM 200 MG/2ML IV SOLN
INTRAVENOUS | Status: AC
  Filled 2017-09-18: qty 2

## 2017-09-18 MED ORDER — DIPHENHYDRAMINE HCL 12.5 MG/5ML PO ELIX: 12.5000 mg | ORAL_SOLUTION | Freq: Four times a day (QID) | ORAL | Status: DC | PRN

## 2017-09-18 MED ORDER — NICOTINE 10 MG IN INHA: 1.0000 | RESPIRATORY_TRACT | Status: DC | PRN

## 2017-09-18 MED ORDER — LORAZEPAM 1 MG PO TABS
1.0000 mg | ORAL_TABLET | Freq: Three times a day (TID) | ORAL | Status: DC | PRN
  Administered 2017-09-19 (×2): 1 mg via ORAL
  Filled 2017-09-18 (×3): qty 1

## 2017-09-18 MED ORDER — LIDOCAINE 2% (20 MG/ML) 5 ML SYRINGE
INTRAMUSCULAR | Status: AC
  Filled 2017-09-18: qty 10

## 2017-09-18 MED ORDER — OXYCODONE HCL 5 MG/5ML PO SOLN
5.0000 mg | Freq: Once | ORAL | Status: DC | PRN
  Filled 2017-09-18: qty 5

## 2017-09-18 MED ORDER — ROCURONIUM BROMIDE 10 MG/ML (PF) SYRINGE
PREFILLED_SYRINGE | INTRAVENOUS | Status: DC | PRN
  Administered 2017-09-18: 20 mg via INTRAVENOUS
  Administered 2017-09-18 (×2): 10 mg via INTRAVENOUS
  Administered 2017-09-18: 50 mg via INTRAVENOUS

## 2017-09-18 MED ORDER — ACETAMINOPHEN 325 MG PO TABS
650.0000 mg | ORAL_TABLET | ORAL | Status: DC | PRN
  Administered 2017-09-18 – 2017-09-19 (×2): 650 mg via ORAL
  Filled 2017-09-18 (×2): qty 2

## 2017-09-18 MED ORDER — FENTANYL CITRATE (PF) 100 MCG/2ML IJ SOLN
INTRAMUSCULAR | Status: AC
  Administered 2017-09-18: 25 ug via INTRAVENOUS
  Filled 2017-09-18: qty 2

## 2017-09-18 MED ORDER — PROMETHAZINE HCL 25 MG/ML IJ SOLN: 6.2500 mg | INTRAMUSCULAR | Status: DC | PRN

## 2017-09-18 MED ORDER — SUGAMMADEX SODIUM 200 MG/2ML IV SOLN
INTRAVENOUS | Status: DC | PRN
  Administered 2017-09-18: 200 mg via INTRAVENOUS

## 2017-09-18 MED ORDER — SODIUM CHLORIDE 0.9 % IR SOLN
Status: DC | PRN
  Administered 2017-09-18: 1000 mL

## 2017-09-18 MED ORDER — LIDOCAINE 2% (20 MG/ML) 5 ML SYRINGE
INTRAMUSCULAR | Status: DC | PRN
  Administered 2017-09-18: 80 mg via INTRAVENOUS

## 2017-09-18 MED ORDER — KETOROLAC TROMETHAMINE 15 MG/ML IJ SOLN
15.0000 mg | Freq: Four times a day (QID) | INTRAMUSCULAR | Status: DC
  Administered 2017-09-18 – 2017-09-19 (×5): 15 mg via INTRAVENOUS
  Filled 2017-09-18 (×4): qty 1

## 2017-09-18 MED ORDER — SUCCINYLCHOLINE CHLORIDE 200 MG/10ML IV SOSY
PREFILLED_SYRINGE | INTRAVENOUS | Status: AC
  Filled 2017-09-18: qty 10

## 2017-09-18 MED ORDER — OXYCODONE HCL 5 MG PO TABS: 5.0000 mg | ORAL_TABLET | Freq: Once | ORAL | Status: DC | PRN

## 2017-09-18 MED ORDER — KETOROLAC TROMETHAMINE 15 MG/ML IJ SOLN
INTRAMUSCULAR | Status: AC
  Filled 2017-09-18: qty 1

## 2017-09-18 MED ORDER — ONDANSETRON HCL 4 MG/2ML IJ SOLN
INTRAMUSCULAR | Status: DC | PRN
  Administered 2017-09-18: 4 mg via INTRAVENOUS

## 2017-09-18 MED ORDER — HEPARIN SODIUM (PORCINE) 1000 UNIT/ML IJ SOLN
INTRAMUSCULAR | Status: AC
  Filled 2017-09-18: qty 1

## 2017-09-18 MED ORDER — HYDROMORPHONE HCL 2 MG/ML IJ SOLN
INTRAMUSCULAR | Status: AC
  Filled 2017-09-18: qty 1

## 2017-09-18 MED ORDER — FENTANYL CITRATE (PF) 100 MCG/2ML IJ SOLN
INTRAMUSCULAR | Status: AC
  Filled 2017-09-18: qty 2

## 2017-09-18 MED ORDER — FENTANYL CITRATE (PF) 100 MCG/2ML IJ SOLN
25.0000 ug | INTRAMUSCULAR | Status: DC | PRN
  Administered 2017-09-18: 50 ug via INTRAVENOUS
  Administered 2017-09-18 (×2): 25 ug via INTRAVENOUS

## 2017-09-18 MED ORDER — PROPOFOL 10 MG/ML IV BOLUS
INTRAVENOUS | Status: AC
  Filled 2017-09-18: qty 40

## 2017-09-18 MED ORDER — KCL IN DEXTROSE-NACL 20-5-0.45 MEQ/L-%-% IV SOLN
INTRAVENOUS | Status: AC
  Filled 2017-09-18: qty 1000

## 2017-09-18 MED ORDER — MIDAZOLAM HCL 2 MG/2ML IJ SOLN
INTRAMUSCULAR | Status: AC
  Filled 2017-09-18: qty 2

## 2017-09-18 MED ORDER — DOCUSATE SODIUM 100 MG PO CAPS
100.0000 mg | ORAL_CAPSULE | Freq: Two times a day (BID) | ORAL | Status: DC
  Administered 2017-09-18 – 2017-09-19 (×2): 100 mg via ORAL
  Filled 2017-09-18 (×2): qty 1

## 2017-09-18 MED ORDER — SCOPOLAMINE 1 MG/3DAYS TD PT72
MEDICATED_PATCH | TRANSDERMAL | Status: AC
  Filled 2017-09-18: qty 1

## 2017-09-18 MED ORDER — MIDAZOLAM HCL 5 MG/5ML IJ SOLN
INTRAMUSCULAR | Status: DC | PRN
  Administered 2017-09-18: 2 mg via INTRAVENOUS

## 2017-09-18 MED ORDER — LACTATED RINGERS IV SOLN: INTRAVENOUS | Status: DC

## 2017-09-18 MED ORDER — MORPHINE SULFATE (PF) 4 MG/ML IV SOLN
INTRAVENOUS | Status: AC
  Filled 2017-09-18: qty 1

## 2017-09-18 MED ORDER — CEFAZOLIN SODIUM-DEXTROSE 1-4 GM/50ML-% IV SOLN
1.0000 g | Freq: Three times a day (TID) | INTRAVENOUS | Status: AC
  Administered 2017-09-18 – 2017-09-19 (×2): 1 g via INTRAVENOUS
  Filled 2017-09-18 (×2): qty 50

## 2017-09-18 MED ORDER — ROCURONIUM BROMIDE 10 MG/ML (PF) SYRINGE
PREFILLED_SYRINGE | INTRAVENOUS | Status: AC
  Filled 2017-09-18: qty 5

## 2017-09-18 MED ORDER — BUPIVACAINE HCL (PF) 0.25 % IJ SOLN
INTRAMUSCULAR | Status: DC | PRN
  Administered 2017-09-18: 30 mL

## 2017-09-18 MED ORDER — FENTANYL CITRATE (PF) 250 MCG/5ML IJ SOLN
INTRAMUSCULAR | Status: AC
  Filled 2017-09-18: qty 5

## 2017-09-18 MED ORDER — LACTATED RINGERS IV SOLN
INTRAVENOUS | Status: DC | PRN
  Administered 2017-09-18: 1000 mL

## 2017-09-18 MED ORDER — CEFAZOLIN SODIUM-DEXTROSE 2-4 GM/100ML-% IV SOLN
2.0000 g | Freq: Once | INTRAVENOUS | Status: AC
  Administered 2017-09-18: 2 g via INTRAVENOUS
  Filled 2017-09-18: qty 100

## 2017-09-18 SURGICAL SUPPLY — 54 items
ADH SKN CLS APL DERMABOND .7 (GAUZE/BANDAGES/DRESSINGS)
APPLICATOR COTTON TIP 6IN STRL (MISCELLANEOUS) ×3 IMPLANT
CATH FOLEY 2WAY SLVR 18FR 30CC (CATHETERS) ×3 IMPLANT
CATH ROBINSON RED A/P 16FR (CATHETERS) ×3 IMPLANT
CATH ROBINSON RED A/P 8FR (CATHETERS) ×3 IMPLANT
CATH TIEMANN FOLEY 18FR 5CC (CATHETERS) ×3 IMPLANT
CHLORAPREP W/TINT 26ML (MISCELLANEOUS) ×3 IMPLANT
CLIP VESOLOCK LG 6/CT PURPLE (CLIP) ×6 IMPLANT
COVER SURGICAL LIGHT HANDLE (MISCELLANEOUS) ×3 IMPLANT
COVER TIP SHEARS 8 DVNC (MISCELLANEOUS) ×2 IMPLANT
COVER TIP SHEARS 8MM DA VINCI (MISCELLANEOUS) ×1
CUTTER ECHEON FLEX ENDO 45 340 (ENDOMECHANICALS) ×3 IMPLANT
DECANTER SPIKE VIAL GLASS SM (MISCELLANEOUS) ×2 IMPLANT
DERMABOND ADVANCED (GAUZE/BANDAGES/DRESSINGS)
DERMABOND ADVANCED .7 DNX12 (GAUZE/BANDAGES/DRESSINGS) IMPLANT
DRAPE ARM DVNC X/XI (DISPOSABLE) ×8 IMPLANT
DRAPE COLUMN DVNC XI (DISPOSABLE) ×2 IMPLANT
DRAPE DA VINCI XI ARM (DISPOSABLE) ×4
DRAPE DA VINCI XI COLUMN (DISPOSABLE) ×1
DRAPE SURG IRRIG POUCH 19X23 (DRAPES) ×3 IMPLANT
DRSG TEGADERM 4X4.75 (GAUZE/BANDAGES/DRESSINGS) ×3 IMPLANT
ELECT REM PT RETURN 15FT ADLT (MISCELLANEOUS) ×3 IMPLANT
GLOVE BIO SURGEON STRL SZ 6.5 (GLOVE) ×3 IMPLANT
GLOVE BIOGEL M STRL SZ7.5 (GLOVE) ×6 IMPLANT
GOWN STRL REUS W/TWL LRG LVL3 (GOWN DISPOSABLE) ×9 IMPLANT
HOLDER FOLEY CATH W/STRAP (MISCELLANEOUS) ×3 IMPLANT
IRRIG SUCT STRYKERFLOW 2 WTIP (MISCELLANEOUS) ×3
IRRIGATION SUCT STRKRFLW 2 WTP (MISCELLANEOUS) ×2 IMPLANT
IV LACTATED RINGERS 1000ML (IV SOLUTION) ×3 IMPLANT
NDL SAFETY ECLIPSE 18X1.5 (NEEDLE) ×2 IMPLANT
NEEDLE HYPO 18GX1.5 SHARP (NEEDLE) ×3
PACK ROBOT UROLOGY CUSTOM (CUSTOM PROCEDURE TRAY) ×3 IMPLANT
RELOAD STAPLE 45 4.1 GRN THCK (STAPLE) ×2 IMPLANT
SEAL CANN UNIV 5-8 DVNC XI (MISCELLANEOUS) ×8 IMPLANT
SEAL XI 5MM-8MM UNIVERSAL (MISCELLANEOUS) ×4
SOLUTION ELECTROLUBE (MISCELLANEOUS) ×3 IMPLANT
STAPLE RELOAD 45 GRN (STAPLE) ×2 IMPLANT
STAPLE RELOAD 45MM GREEN (STAPLE) ×3
SUT ETHILON 3 0 PS 1 (SUTURE) ×3 IMPLANT
SUT MNCRL 3 0 RB1 (SUTURE) ×2 IMPLANT
SUT MNCRL 3 0 VIOLET RB1 (SUTURE) ×2 IMPLANT
SUT MNCRL AB 4-0 PS2 18 (SUTURE) ×6 IMPLANT
SUT MONOCRYL 3 0 RB1 (SUTURE) ×4
SUT VIC AB 0 CT1 27 (SUTURE) ×3
SUT VIC AB 0 CT1 27XBRD ANTBC (SUTURE) ×2 IMPLANT
SUT VIC AB 0 UR5 27 (SUTURE) ×3 IMPLANT
SUT VIC AB 2-0 SH 27 (SUTURE) ×3
SUT VIC AB 2-0 SH 27X BRD (SUTURE) ×2 IMPLANT
SUT VICRYL 0 UR6 27IN ABS (SUTURE) ×6 IMPLANT
SYR 27GX1/2 1ML LL SAFETY (SYRINGE) ×3 IMPLANT
TOWEL OR 17X26 10 PK STRL BLUE (TOWEL DISPOSABLE) ×3 IMPLANT
TOWEL OR NON WOVEN STRL DISP B (DISPOSABLE) ×3 IMPLANT
TUBING INSUFFLATION 10FT LAP (TUBING) IMPLANT
WATER STERILE IRR 1000ML POUR (IV SOLUTION) ×6 IMPLANT

## 2017-09-18 NOTE — Transfer of Care (Signed)
Immediate Anesthesia Transfer of Care Note  Patient: Gabriel Griffith  Procedure(s) Performed: XI ROBOTIC ASSISTED LAPAROSCOPIC RADICAL PROSTATECTOMY LEVEL 2 (N/A ) LYMPHADENECTOMY, PELVIC, (Bilateral )  Patient Location: PACU  Anesthesia Type:General  Level of Consciousness: alert   Airway & Oxygen Therapy: Patient Spontanous Breathing and Patient connected to face mask oxygen  Post-op Assessment: Report given to RN and Post -op Vital signs reviewed and stable  Post vital signs: Reviewed and stable  Last Vitals:  Vitals:   09/18/17 0545  BP: 107/76  Pulse: 72  Resp: 18  Temp: 36.9 C  SpO2: 99%    Last Pain:  Vitals:   09/18/17 0617  TempSrc:   PainSc: 2          Complications: No apparent anesthesia complications

## 2017-09-18 NOTE — Progress Notes (Signed)
Post-op note  Subjective: The patient is doing well.  No complaints.  Denies N/V  Objective: Vital signs in last 24 hours: Temp:  [97.6 F (36.4 C)-98.4 F (36.9 C)] 98 F (36.7 C) (03/14 1300) Pulse Rate:  [72-89] 88 (03/14 1300) Resp:  [10-19] 11 (03/14 1300) BP: (107-137)/(76-93) 127/83 (03/14 1300) SpO2:  [98 %-100 %] 99 % (03/14 1300) Weight:  [87.1 kg (192 lb)] 87.1 kg (192 lb) (03/14 0617)  Intake/Output from previous day: No intake/output data recorded. Intake/Output this shift: Total I/O In: 1200 [I.V.:1200] Out: 100 [Blood:100]  Physical Exam:  General: Alert and oriented. Abdomen: Soft, Nondistended. Incisions: Clean and dry. Urine: red  Lab Results: Recent Labs    09/18/17 1055  HGB 14.2  HCT 41.4    Assessment/Plan: POD#0   1) Continue to monitor  2) DVT prophy, clears, IS, amb, pain control   LOS: 0 days   Debbrah Alar 09/18/2017, 1:45 PM

## 2017-09-18 NOTE — Discharge Instructions (Signed)

## 2017-09-18 NOTE — Anesthesia Postprocedure Evaluation (Signed)
Anesthesia Post Note  Patient: Gabriel Griffith  Procedure(s) Performed: XI ROBOTIC ASSISTED LAPAROSCOPIC RADICAL PROSTATECTOMY LEVEL 2 (N/A ) LYMPHADENECTOMY, PELVIC, (Bilateral )     Patient location during evaluation: PACU Anesthesia Type: General Level of consciousness: awake and alert Pain management: pain level controlled Vital Signs Assessment: post-procedure vital signs reviewed and stable Respiratory status: spontaneous breathing, nonlabored ventilation, respiratory function stable and patient connected to nasal cannula oxygen Cardiovascular status: blood pressure returned to baseline and stable Postop Assessment: no apparent nausea or vomiting Anesthetic complications: no    Last Vitals:  Vitals:   09/18/17 1130 09/18/17 1145  BP: 123/89 126/83  Pulse: 74 75  Resp: 11 10  Temp:    SpO2: 98% 99%    Last Pain:  Vitals:   09/18/17 1145  TempSrc:   PainSc: [place] E Jakylan Ron

## 2017-09-18 NOTE — Op Note (Signed)

## 2017-09-18 NOTE — Anesthesia Procedure Notes (Signed)
Procedure Name: Intubation Date/Time: 09/18/2017 7:28 AM Performed by: Lavina Hamman, CRNA Pre-anesthesia Checklist: Patient identified Patient Re-evaluated:Patient Re-evaluated prior to induction Oxygen Delivery Method: Circle system utilized Preoxygenation: Pre-oxygenation with 100% oxygen Induction Type: IV induction Ventilation: Two handed mask ventilation required and Mask ventilation without difficulty Laryngoscope Size: Mac and 3 Grade View: Grade II Tube type: Oral Tube size: 7.5 mm Number of attempts: 1 Airway Equipment and Method: Stylet (Anterior pressure) Placement Confirmation: ETT inserted through vocal cords under direct vision,  positive ETCO2 and breath sounds checked- equal and bilateral Secured at: 23 cm Tube secured with: Tape Dental Injury: Teeth and Oropharynx as per pre-operative assessment  Comments: DL by SRNA. Minimal tracheal deviation to left side.

## 2017-09-19 ENCOUNTER — Other Ambulatory Visit: Payer: Self-pay

## 2017-09-19 ENCOUNTER — Encounter (HOSPITAL_COMMUNITY): Payer: Self-pay | Admitting: Urology

## 2017-09-19 DIAGNOSIS — C61 Malignant neoplasm of prostate: Secondary | ICD-10-CM | POA: Diagnosis not present

## 2017-09-19 LAB — HEMOGLOBIN AND HEMATOCRIT, BLOOD
HCT: 37.1 % — ABNORMAL LOW (ref 39.0–52.0)
Hemoglobin: 12.5 g/dL — ABNORMAL LOW (ref 13.0–17.0)

## 2017-09-19 MED ORDER — HYDROCODONE-ACETAMINOPHEN 5-325 MG PO TABS: 1.0000 | ORAL_TABLET | Freq: Four times a day (QID) | ORAL | Status: DC | PRN

## 2017-09-19 MED ORDER — BISACODYL 10 MG RE SUPP
10.0000 mg | Freq: Once | RECTAL | Status: AC
  Administered 2017-09-19: 10 mg via RECTAL
  Filled 2017-09-19: qty 1

## 2017-09-19 NOTE — Discharge Summary (Signed)
  Date of admission: 09/18/2017  Date of discharge: 09/19/2017  Admission diagnosis: Prostate Cancer  Discharge diagnosis: Prostate Cancer  History and Physical: For full details, please see admission history and physical. Briefly, Gabriel Griffith is a 52 y.o. gentleman with localized prostate cancer.  After discussing management/treatment options, he elected to proceed with surgical treatment.  Hospital Course: Gabriel Griffith was taken to the operating room on 09/18/2017 and underwent a robotic assisted laparoscopic radical prostatectomy. He tolerated this procedure well and without complications. Postoperatively, he was able to be transferred to a regular hospital room following recovery from anesthesia.  He was able to begin ambulating the night of surgery. He remained hemodynamically stable overnight.  He had excellent urine output with appropriately minimal output from his pelvic drain and his pelvic drain was removed on POD #1.  He was transitioned to oral pain medication, tolerated a clear liquid diet, and had met all discharge criteria and was able to be discharged home later on POD#1.  Laboratory values:  Recent Labs    09/18/17 1055 09/19/17 0453  HGB 14.2 12.5*  HCT 41.4 37.1*    Disposition: Home  Discharge instruction: He was instructed to be ambulatory but to refrain from heavy lifting, strenuous activity, or driving. He was instructed on urethral catheter care.  Discharge medications:   Allergies as of 09/19/2017   No Known Allergies     Medication List    STOP taking these medications   aspirin 325 MG tablet   cyclobenzaprine 10 MG tablet Commonly known as:  FLEXERIL     TAKE these medications   acetaminophen 500 MG tablet Commonly known as:  TYLENOL Take 1,000 mg by mouth every 6 (six) hours as needed for moderate pain.   HYDROcodone-acetaminophen 5-325 MG tablet Commonly known as:  NORCO Take 1-2 tablets by mouth every 6 (six) hours as needed for  moderate pain or severe pain.   LORazepam 1 MG tablet Commonly known as:  ATIVAN Take 1 tablet (1 mg total) by mouth every 8 (eight) hours as needed for anxiety. What changed:  when to take this   nicotine 10 MG inhaler Commonly known as:  NICOTROL Inhale 1 continuous puffing into the lungs as needed for smoking cessation.   psyllium 58.6 % packet Commonly known as:  METAMUCIL Take 1 packet by mouth daily.   sulfamethoxazole-trimethoprim 800-160 MG tablet Commonly known as:  BACTRIM DS,SEPTRA DS Take 1 tablet by mouth 2 (two) times daily. Start the day prior to foley removal appointment       Followup: He will followup in 1 week for catheter removal and to discuss his surgical pathology results.

## 2017-09-19 NOTE — Progress Notes (Signed)
Patient ID: Gabriel Griffith, male   DOB: 1966-01-15, 52 y.o.   MRN: [id number]  1 Day Post-Op Subjective: The patient is doing well.  No nausea or vomiting. Pain is adequately controlled.  Objective: Vital signs in last 24 hours: Temp:  [97.6 F (36.4 C)-99.5 F (37.5 C)] 98.6 F (37 C) (03/15 0532) Pulse Rate:  [62-95] 62 (03/15 0532) Resp:  [10-19] 14 (03/15 0532) BP: (103-137)/(70-93) 103/70 (03/15 0532) SpO2:  [97 %-100 %] 99 % (03/15 0532)  Intake/Output from previous day: 03/14 0701 - 03/15 0700 In: 3042.5 [P.O.:240; I.V.:2702.5; IV Piggyback:100] Out: 7253 [Urine:2600; Drains:117; Blood:100] Intake/Output this shift: No intake/output data recorded.  Physical Exam:  General: Alert and oriented. CV: RRR Lungs: Clear bilaterally. GI: Soft, Nondistended. Incisions: Clean, dry, and intact Urine: Clear Extremities: Nontender, no erythema, no edema.  Lab Results: Recent Labs    09/18/17 1055 09/19/17 0453  HGB 14.2 12.5*  HCT 41.4 37.1*      Assessment/Plan: POD# 1 s/p robotic prostatectomy.  1) SL IVF 2) Ambulate, Incentive spirometry 3) Transition to oral pain medication 4) Dulcolax suppository 5) D/C pelvic drain 6) Plan for likely discharge later today   Gabriel Griffith. MD   LOS: 0 days   Gabriel Griffith,LES 09/19/2017, 7:21 AM

## 2017-10-28 ENCOUNTER — Encounter: Payer: Self-pay | Admitting: Family Medicine

## 2017-10-28 DIAGNOSIS — E041 Nontoxic single thyroid nodule: Secondary | ICD-10-CM

## 2017-10-28 DIAGNOSIS — R221 Localized swelling, mass and lump, neck: Secondary | ICD-10-CM

## 2017-11-03 NOTE — Telephone Encounter (Signed)
The MRI was scheduled

## 2017-11-22 ENCOUNTER — Ambulatory Visit
Admission: RE | Admit: 2017-11-22 | Discharge: 2017-11-22 | Disposition: A | Discharge status: Home / Self Care | Payer: BLUE CROSS/BLUE SHIELD | Source: Ambulatory Visit | Attending: Family Medicine | Admitting: Family Medicine

## 2017-11-22 DIAGNOSIS — E041 Nontoxic single thyroid nodule: Secondary | ICD-10-CM

## 2017-11-22 DIAGNOSIS — R221 Localized swelling, mass and lump, neck: Secondary | ICD-10-CM

## 2017-11-22 MED ORDER — GADOBENATE DIMEGLUMINE 529 MG/ML IV SOLN
17.0000 mL | Freq: Once | INTRAVENOUS | Status: AC | PRN
  Administered 2017-11-22: 17 mL via INTRAVENOUS

## 2018-01-14 ENCOUNTER — Ambulatory Visit: Payer: BLUE CROSS/BLUE SHIELD | Admitting: Genetics

## 2018-01-14 DIAGNOSIS — Z807 Family history of other malignant neoplasms of lymphoid, hematopoietic and related tissues: Secondary | ICD-10-CM

## 2018-01-14 DIAGNOSIS — C61 Malignant neoplasm of prostate: Secondary | ICD-10-CM

## 2018-01-14 DIAGNOSIS — Z8042 Family history of malignant neoplasm of prostate: Secondary | ICD-10-CM

## 2018-01-15 ENCOUNTER — Encounter: Payer: Self-pay | Admitting: Genetics

## 2018-01-15 DIAGNOSIS — Z8042 Family history of malignant neoplasm of prostate: Secondary | ICD-10-CM

## 2018-01-15 DIAGNOSIS — Z807 Family history of other malignant neoplasms of lymphoid, hematopoietic and related tissues: Secondary | ICD-10-CM

## 2018-01-15 HISTORY — DX: Family history of other malignant neoplasms of lymphoid, hematopoietic and related tissues: Z80.7

## 2018-01-15 HISTORY — DX: Family history of malignant neoplasm of prostate: Z80.42

## 2018-01-15 NOTE — Progress Notes (Addendum)
REFERRING PROVIDER: Self-referral  PRIMARY PROVIDER:  Laurey Morale, MD  PRIMARY REASON FOR VISIT:  1. Prostate cancer ([place])   2. Family history of prostate cancer   3. Family history of non-Hodgkin's lymphoma    HISTORY OF PRESENT ILLNESS:   Gabriel Griffith, a 52 y.o. male, was seen for a [facility] cancer genetics consultation due to a personal and family history of prostate cancer.  Gabriel Griffith presents to clinic today to discuss the possibility of a hereditary predisposition to cancer, genetic testing, and to further clarify his future cancer risks, as well as potential cancer risks for family members.   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   He underwent Robotic assisted laproscopic radical prostatectomy with pelvic lymphadenectomy on 09/18/2017.    Colonoscopy: yes; Feb 2019- 2 polyps removed- 1 was tubular adenoma.  Past Medical History:  Diagnosis Date  . Anxiety   . Benign tumor of parotid gland   . Chronic pelvic pain in male   . Complication of anesthesia    difficulty breating after laminectomy because we was not wearing his breath right strips  . Family history of non-Hodgkin's lymphoma 01/15/2018  . Family history of prostate cancer 01/15/2018  . History of pleurisy   . History of thyroid nodule    4cm, benign  . History of tonsillectomy   . Nasal septal deviation   . OA (osteoarthritis) of hip    right  . Prostate CA ([place])   . Ruptured disk    L5, S1  . Sciatic leg pain    left  . Sigmoid diverticulosis     Past Surgical History:  Procedure Laterality Date  . COLONOSCOPY    . LUMBAR LAMINECTOMY  1996  . LYMPHADENECTOMY Bilateral 09/18/2017   Procedure: LYMPHADENECTOMY, PELVIC,;  Surgeon: Raynelle Bring, MD;  Location: [facility];  Service: Urology;  Laterality: Bilateral;  . PAROTID GLAND TUMOR EXCISION    . PROSTATE BIOPSY    . ROBOT ASSISTED LAPAROSCOPIC RADICAL PROSTATECTOMY N/A 09/18/2017    Procedure: XI ROBOTIC ASSISTED LAPAROSCOPIC RADICAL PROSTATECTOMY LEVEL 2;  Surgeon: Raynelle Bring, MD;  Location: [facility];  Service: Urology;  Laterality: N/A;  . TONSILLECTOMY    . VASECTOMY      Social History   Socioeconomic History  . Marital status: Married    Spouse name: Not on file  . Number of children: 3  . Years of education: Not on file  . Highest education level: Not on file  Occupational History  . Occupation: Retired  Scientific laboratory technician  . Financial resource strain: Not on file  . Food insecurity:    Worry: Not on file    Inability: Not on file  . Transportation needs:    Medical: Not on file    Non-medical: Not on file  Tobacco Use  . Smoking status: Former Smoker    Packs/day: 1.00    Years: 35.00    Pack years: 35.00    Types: Cigarettes  . Smokeless tobacco: Former Systems developer    Quit date: 04/20/2017  . Tobacco comment: started E cigarettes 2015, Last 5 days started back smoking last cigarette 09/08/2017  Substance and Sexual Activity  . Alcohol use: No    Alcohol/week: 0.0 oz  . Drug use: Yes    Types: Marijuana    Comment: every other day  . Sexual activity: Not on file  Lifestyle  . Physical activity:    Days per week:  Not on file    Minutes per session: Not on file  . Stress: Not on file  Relationships  . Social connections:    Talks on phone: Not on file    Gets together: Not on file    Attends religious service: Not on file    Active member of club or organization: Not on file    Attends meetings of clubs or organizations: Not on file    Relationship status: Not on file  Other Topics Concern  . Not on file  Social History Narrative  . Not on file     FAMILY HISTORY:  We obtained a detailed, 4-generation family history.  Significant diagnoses are listed below: Family History  Problem Relation Age of Onset  . Coronary artery disease Unknown   . Hypothyroidism Mother   . Lung cancer Father        died at 74  . Non-Hodgkin's lymphoma Brother    . Lung cancer Maternal Grandfather   . Prostate cancer Maternal Uncle        dx 57's  . Prostate cancer Maternal Uncle        dx 16's  . Lung cancer Paternal Grandmother    Gabriel Griffith has 3 sons ages 44, 12, and 34 with no history of cancer.  Gabriel Griffith has 1 sister and 4 brothers.  1 brother has a hx of Non-Hodgkin's Lymphoma.  No nices/nephews with cancer.   Gabriel Griffith father: died at 43, hx of lung cancer.  Paternal Aunts/Uncles: no cancer reported Paternal grandfather: deceased, no cancer Paternal grandmother:deceased, hx of lung cancer.   Gabriel Griffith mother: 36, is 7% Ashkenazi Jewish, no history of cancer.  Maternal Aunts/Uncles: 2 paternal uncles have a hx of prostate cancer in their 46's.  They both survived their cancer, unk Gleason scores Maternal grandfather: hx lung cancer Maternal grandmother: no history of cancer.   Gabriel Griffith's sons are undergoing genetic testing due to their mother's history of Lynch Syndrome- otherwise no other relatives reported to have genetic testing.Patient's maternal ancestors are of N. European/Ashkenazi Jewish descent, and paternal ancestors are of Northern European descent. There is no known consanguinity.  GENETIC COUNSELING ASSESSMENT: Gabriel Griffith is a 52 y.o. male with a personal and family history which is somewhat suggestive of a Hereditary Cancer Predisposition Syndrome. We, therefore, discussed and recommended the following at today's visit.   DISCUSSION: We reviewed the characteristics, features and inheritance patterns of hereditary cancer syndromes. We also discussed genetic testing, including the appropriate family members to test, the process of testing, insurance coverage and turn-around-time for results. We discussed the implications of a negative, positive and/or variant of uncertain significant result. We recommended Gabriel Griffith pursue genetic testing for the Multi-Cancer gene panel.   The Multi-Cancer Panel  offered by [facility] includes sequencing and/or deletion duplication testing of the following 83 genes: ALK, APC, ATM, AXIN2,BAP1,  BARD1, BLM, BMPR1A, BRCA1, BRCA2, BRIP1, CASR, CDC73, CDH1, CDK4, CDKN1B, CDKN1C, CDKN2A (p14ARF), CDKN2A (p16INK4a), CEBPA, CHEK2, CTNNA1, DICER1, DIS3L2, EGFR (c.2369C>T, p.Thr790Met variant only), EPCAM (Deletion/duplication testing only), FH, FLCN, GATA2, GPC3, GREM1 (Promoter region deletion/duplication testing only), HOXB13 (c.251G>A, p.Gly84Glu), HRAS, KIT, MAX, MEN1, MET, MITF (c.952G>A, p.Glu318Lys variant only), MLH1, MSH2, MSH3, MSH6, MUTYH, NBN, NF1, NF2, NTHL1, PALB2, PDGFRA, PHOX2B, PMS2, POLD1, POLE, POT1, PRKAR1A, PTCH1, PTEN, RAD50, RAD51C, RAD51D, RB1, RECQL4, RET, RUNX1, SDHAF2, SDHA (sequence changes only), SDHB, SDHC, SDHD, SMAD4, SMARCA4, SMARCB1, SMARCE1, STK11, SUFU, TERC, TERT, TMEM127, TP53, TSC1, TSC2, VHL, WRN and WT1.  We discussed that only 5-10% of cancers are associated with a Hereditary cancer predisposition syndrome.  One of the most common hereditary cancer syndromes that increases prostate cancer risk is called Hereditary Breast and Ovarian Cancer (HBOC) syndrome.  This syndrome is caused by mutations in the BRCA1 and BRCA2 genes.  This syndrome increases an individual's lifetime risk to develop prostate, breast, ovarian, pancreatic, and other types of cancer.  There are also many other cancer predisposition syndromes caused by mutations in several other genes.  We discussed that if he is found to have a mutation in one of these genes, it may impact future medical management recommendations such as increased cancer screenings and consideration of risk reducing surgeries.  A positive result could also have implications for the patient's family members.  A Negative result would mean we did not to identify a hereditary component to his cancer, but does not rule out the possibility of a hereditary basis for his cancer.  There could be mutations that  are undetectable by current technology, or in genes not yet tested or identified to increase cancer risk.    We discussed the potential to find a Variant of Uncertain Significance or VUS.  These are variants that have not yet been identified as pathogenic or benign, and it is unknown if this variant is associated with increased cancer risk or if this is a normal finding.  Most VUS's are reclassified to benign or likely benign.   It should not be used to make medical management decisions. With time, we suspect the lab will determine the significance of any VUS's identified if any.   Based on Gabriel Griffith's personal and family history of cancer, he meets NCCN medical criteria for genetic testing. Despite that he meets criteria, he may still have an out of pocket cost. The laboratory can provide him with an estimate of his OOP cost.   PLAN: After considering the risks, benefits, and limitations, Gabriel Griffith  provided informed consent to pursue genetic testing, but requested a benefits investigation to determine if his insurance would cover testing be performed first before submitting a sample.  We will ask the laboratory to do this investigation for him and with that information, should he with to proceed he will return to provide a sample.   Gabriel Griffith questions were answered to his satisfaction today. Our contact information was provided should additional questions or concerns arise.  Lastly, we encouraged Gabriel Griffith to remain in contact with cancer genetics annually so that we can continuously update the family history and inform him of any changes in cancer genetics and testing that may be of benefit for this family.   Mr.  Griffith questions were answered to his satisfaction today. Our contact information was provided should additional questions or concerns arise. Thank you for the referral and allowing [place] to share in the care of your patient.   Tana Felts, MS, [facility] Certified Genetic  Counselor [email or web address] phone: [phone]  The patient was seen for a total of 40 minutes in face-to-face genetic counseling.  The patient was accompanied today by his sons, Quita Skye and Saralyn Pilar (also getting genetic testing for mother's dx Lynch Syndrome). This patient was discussed with Drs. Magrinat, Lindi Adie and/or Burr Medico who agrees with the above.

## 2018-01-21 ENCOUNTER — Telehealth: Payer: Self-pay | Admitting: Genetics

## 2018-01-21 ENCOUNTER — Encounter: Payer: Self-pay | Admitting: Genetics

## 2018-01-21 NOTE — Telephone Encounter (Signed)
Left message informing patient the laboratory expects his OOP cost to be $0 for genetic testing. Asked him to call back if interested in pursuing testing and we can coordinate.

## 2018-02-19 ENCOUNTER — Telehealth: Payer: Self-pay | Admitting: Genetics

## 2018-02-19 ENCOUNTER — Encounter: Payer: Self-pay | Admitting: Genetics

## 2018-02-19 NOTE — Telephone Encounter (Signed)
Patient said he would like to proceed with genetic testing- we scheduled him for a lab draw 02/26/2018 at 10:30 AM

## 2018-02-26 ENCOUNTER — Inpatient Hospital Stay: Payer: BLUE CROSS/BLUE SHIELD | Attending: Genetic Counselor

## 2018-02-26 DIAGNOSIS — R972 Elevated prostate specific antigen [PSA]: Secondary | ICD-10-CM

## 2018-02-27 ENCOUNTER — Other Ambulatory Visit: Payer: Self-pay | Admitting: Family Medicine

## 2018-02-27 MED ORDER — LORAZEPAM 1 MG PO TABS: 1.0000 mg | ORAL_TABLET | Freq: Three times a day (TID) | ORAL | 5 refills | Status: DC | PRN

## 2018-02-27 NOTE — Telephone Encounter (Signed)
Last OV 07/29/2017   Last refilled 07/29/2017 disp 90 with 5 refills   Sent to PCP for approval

## 2018-02-27 NOTE — Telephone Encounter (Signed)
Call in #[phone] with 5 rf 

## 2018-03-11 ENCOUNTER — Telehealth: Payer: Self-pay | Admitting: Genetics

## 2018-03-11 ENCOUNTER — Encounter: Payer: Self-pay | Admitting: Genetics

## 2018-03-11 ENCOUNTER — Ambulatory Visit: Payer: Self-pay | Admitting: Genetics

## 2018-03-11 DIAGNOSIS — Z1379 Encounter for other screening for genetic and chromosomal anomalies: Secondary | ICD-10-CM

## 2018-03-11 DIAGNOSIS — Z807 Family history of other malignant neoplasms of lymphoid, hematopoietic and related tissues: Secondary | ICD-10-CM

## 2018-03-11 DIAGNOSIS — Z8042 Family history of malignant neoplasm of prostate: Secondary | ICD-10-CM

## 2018-03-11 DIAGNOSIS — C61 Malignant neoplasm of prostate: Secondary | ICD-10-CM

## 2018-03-11 NOTE — Telephone Encounter (Signed)
Revealed negative genetic testing.  This normal result is reassuring and indicates that it is unlikely Gabriel Griffith's cancer is due to a hereditary cause.  It is unlikely that there is an increased risk of another cancer due to a mutation in one of these genes.  However, genetic testing is not perfect, and cannot definitively rule out a hereditary cause.  It will be important for him to keep in contact with genetics to learn if any additional testing may be needed in the future.     We still recommended men in this family tell their healthcare providers about the family history and pursue prostate cancer screening.

## 2018-03-11 NOTE — Progress Notes (Signed)
HPI:  Mr. Consuegra was previously seen in the [facility] clinic on 01/14/218 due to a personal and family history of prostate cancer and concerns regarding a hereditary predisposition to cancer. Please refer to our prior cancer genetics clinic note for more information regarding Mr. Imber's medical, social and family histories, and our assessment and recommendations, at the time. Mr. Pape recent genetic test results were disclosed to him, as well as recommendations warranted by these results. These results and recommendations are discussed in more detail below.  CANCER HISTORY:  In 2019, at the age of 75, Mr. Carnero was diagnosed with Prostate adenocarcinoma Gleason 3+4=7.   He underwent Robotic assisted laproscopic radical prostatectomy with pelvic lymphadenectomy on 09/18/2017.    FAMILY HISTORY:  We obtained a detailed, 4-generation family history.  Significant diagnoses are listed below: Family History  Problem Relation Age of Onset  . Coronary artery disease Unknown   . Hypothyroidism Mother   . Lung cancer Father        died at 53  . Non-Hodgkin's lymphoma Brother   . Lung cancer Maternal Grandfather   . Prostate cancer Maternal Uncle        dx 41's  . Prostate cancer Maternal Uncle        dx 28's  . Lung cancer Paternal Grandmother     Mr. Evitts has 3 sons ages 42, 50, and 72 with no history of cancer.  Mr. Lack has 1 sister and 4 brothers.  1 brother has a hx of Non-Hodgkin's Lymphoma.  No nices/nephews with cancer.   Mr. Sozio's father: died at 21, hx of lung cancer.  Paternal Aunts/Uncles: no cancer reported Paternal grandfather: deceased, no cancer Paternal grandmother:deceased, hx of lung cancer.   Mr. Hyndman's mother: 7, is 7% Ashkenazi Jewish, no history of cancer.  Maternal Aunts/Uncles: 2 paternal uncles have a hx of prostate cancer in their 65's.  They both survived their cancer, unk Gleason scores Maternal grandfather: hx lung  cancer Maternal grandmother: no history of cancer.   Mr. Elrod's sons are undergoing genetic testing due to their mother's history of Lynch Syndrome- otherwise no other relatives reported to have genetic testing.Patient's maternal ancestors are of N. European/Ashkenazi Jewish descent, and paternal ancestors are of Northern European descent. There is no known consanguinity.  GENETIC TEST RESULTS: Genetic testing performed through [facility] Multi-Cancers Panel reported out on 03/10/2108 showed no pathogenic mutations. The following genes were evaluated for sequence changes and exonic deletions/duplications:AIP, ALK, APC, ATM, AXIN2, BAP1, BARD1, BLM, BMPR1A, BRCA1, BRCA2, BRIP1, CASR, CDC73, CDH1, CDK4, CDKN1B, CDKN1C,CDKN2A (p14ARF), CDKN2A (p16INK4a), CEBPA, CHEK2, CTNNA1, DICER1, DIS3L2, EGFR, EPCAM*, FH, FLCN, GATA2, GPC3,GREM1*, HOXB13, HRAS, KIT, MAX, MEN1, MET, MLH1, MSH2, MSH3, MSH6, MUTYH, NBN, NF1, NF2, NTHL1, PALB2, PDGFRA,PHOX2B*, PMS2, POLD1, POLE, POT1, PRKAR1A, PTCH1, PTEN, RAD50, RAD51C, RAD51D, RB1, RECQL4, RET, RUNX1, SDHAF2,SDHB, SDHC, SDHD, SMAD4, SMARCA4, SMARCB1, SMARCE1, STK11, SUFU, TERC, TERT, TMEM127, TP53, TSC1, TSC2, VHL,WRN, WT1The following genes were evaluated for sequence changes only:MITF*, SDHA*  The test report will be scanned into EPIC and will be located under the Molecular Pathology section of the Results Review tab. A portion of the result report is included below for reference.     We discussed with Mr. Killgore that because current genetic testing is not perfect, it is possible there may be a gene mutation in one of these genes that current testing cannot detect, but that chance is small.  We also discussed, that there could be another gene that has  not yet been discovered, or that we have not yet tested, that is responsible for the cancer diagnoses in the family. It is also possible there is a hereditary cause for the cancer in the family that Mr. Brim did  not inherit and therefore was not identified in his testing.  Therefore, it is important to remain in touch with cancer genetics in the future so that we can continue to offer Mr. Ficek the most up to date genetic testing.   ADDITIONAL GENETIC TESTING: We discussed with Mr. Beagley that his genetic testing was fairly extensive.  If there are are genes identified to increase cancer risk that can be analyzed in the future, we would be happy to discuss and coordinate this testing at that time.    CANCER SCREENING RECOMMENDATIONS: Mr. Buchmann test result is considered negative (normal).  This means that we have not identified a hereditary cause for his personal and family history of cancer at this time.   While reassuring, this does not definitively rule out a hereditary predisposition to cancer. It is still possible that there could be genetic mutations that are undetectable by current technology, or genetic mutations in genes that have not been tested or identified to increase cancer risk.  Therefore, it is recommended he continue to follow the cancer management and screening guidelines provided by his oncology and primary healthcare provider. An individual's cancer risk is not determined by genetic test results alone.  Overall cancer risk assessment includes additional factors such as personal medical history, family history, etc.  These should be used to make a personalized plan for cancer prevention and surveillance.    RECOMMENDATIONS FOR FAMILY MEMBERS:  Relatives in this family might be at some increased risk of developing cancer, over the general population risk, simply due to the family history of cancer.  We recommended women in this family have a yearly mammogram beginning at age 48, or 80 years younger than the earliest onset of cancer, an annual clinical breast exam, and perform monthly breast self-exams. Women in this family should also have a gynecological exam as recommended by their  primary provider. All family members should have a colonoscopy by age 28 (or as directed by their doctors).  All family members should inform their physicians about the family history of cancer so their doctors can make the most appropriate screening recommendations for them.   We recommended men in his family discuss the family history of cancer and prostate cancer screening with their doctors.   FOLLOW-UP: Lastly, we discussed with Mr. Sturgell that cancer genetics is a rapidly advancing field and it is possible that new genetic tests will be appropriate for him and/or his family members in the future. We encouraged him to remain in contact with cancer genetics on an annual basis so we can update his personal and family histories and let him know of advances in cancer genetics that may benefit this family.   Our contact number was provided. Mr. Delph questions were answered to his satisfaction, and he knows he is welcome to call us at anytime with additional questions or concerns.   Ferol Luz, MS, [facility] Certified Genetic Counselor [email or web address]

## 2018-08-12 DIAGNOSIS — G8929 Other chronic pain: Secondary | ICD-10-CM | POA: Insufficient documentation

## 2018-08-12 DIAGNOSIS — M1611 Unilateral primary osteoarthritis, right hip: Secondary | ICD-10-CM | POA: Insufficient documentation

## 2018-08-12 DIAGNOSIS — Z8546 Personal history of malignant neoplasm of prostate: Secondary | ICD-10-CM | POA: Insufficient documentation

## 2018-09-02 ENCOUNTER — Other Ambulatory Visit: Payer: Self-pay | Admitting: Family Medicine

## 2018-09-02 NOTE — Telephone Encounter (Signed)
Dr. Fry please advise on refill of med   Thanks  

## 2018-09-07 NOTE — Telephone Encounter (Signed)
Call in #[phone] with no refills. He needs an OV soon

## 2018-09-09 NOTE — Telephone Encounter (Signed)
rx has been called to the pharamcy and left on the VM

## 2018-11-07 IMAGING — DX DG CHEST 2V
2 series · 3 of 3 positions shown · non-contrast
Comparison: None.

CLINICAL DATA: History of melanoma. Routine checkup. Intermittent
mid left chest pain.

EXAM:
CHEST  2 VIEW

[Series 1: chest pa · 0.14mm/px · 2 of 2 slices shown]
[im 1/2]
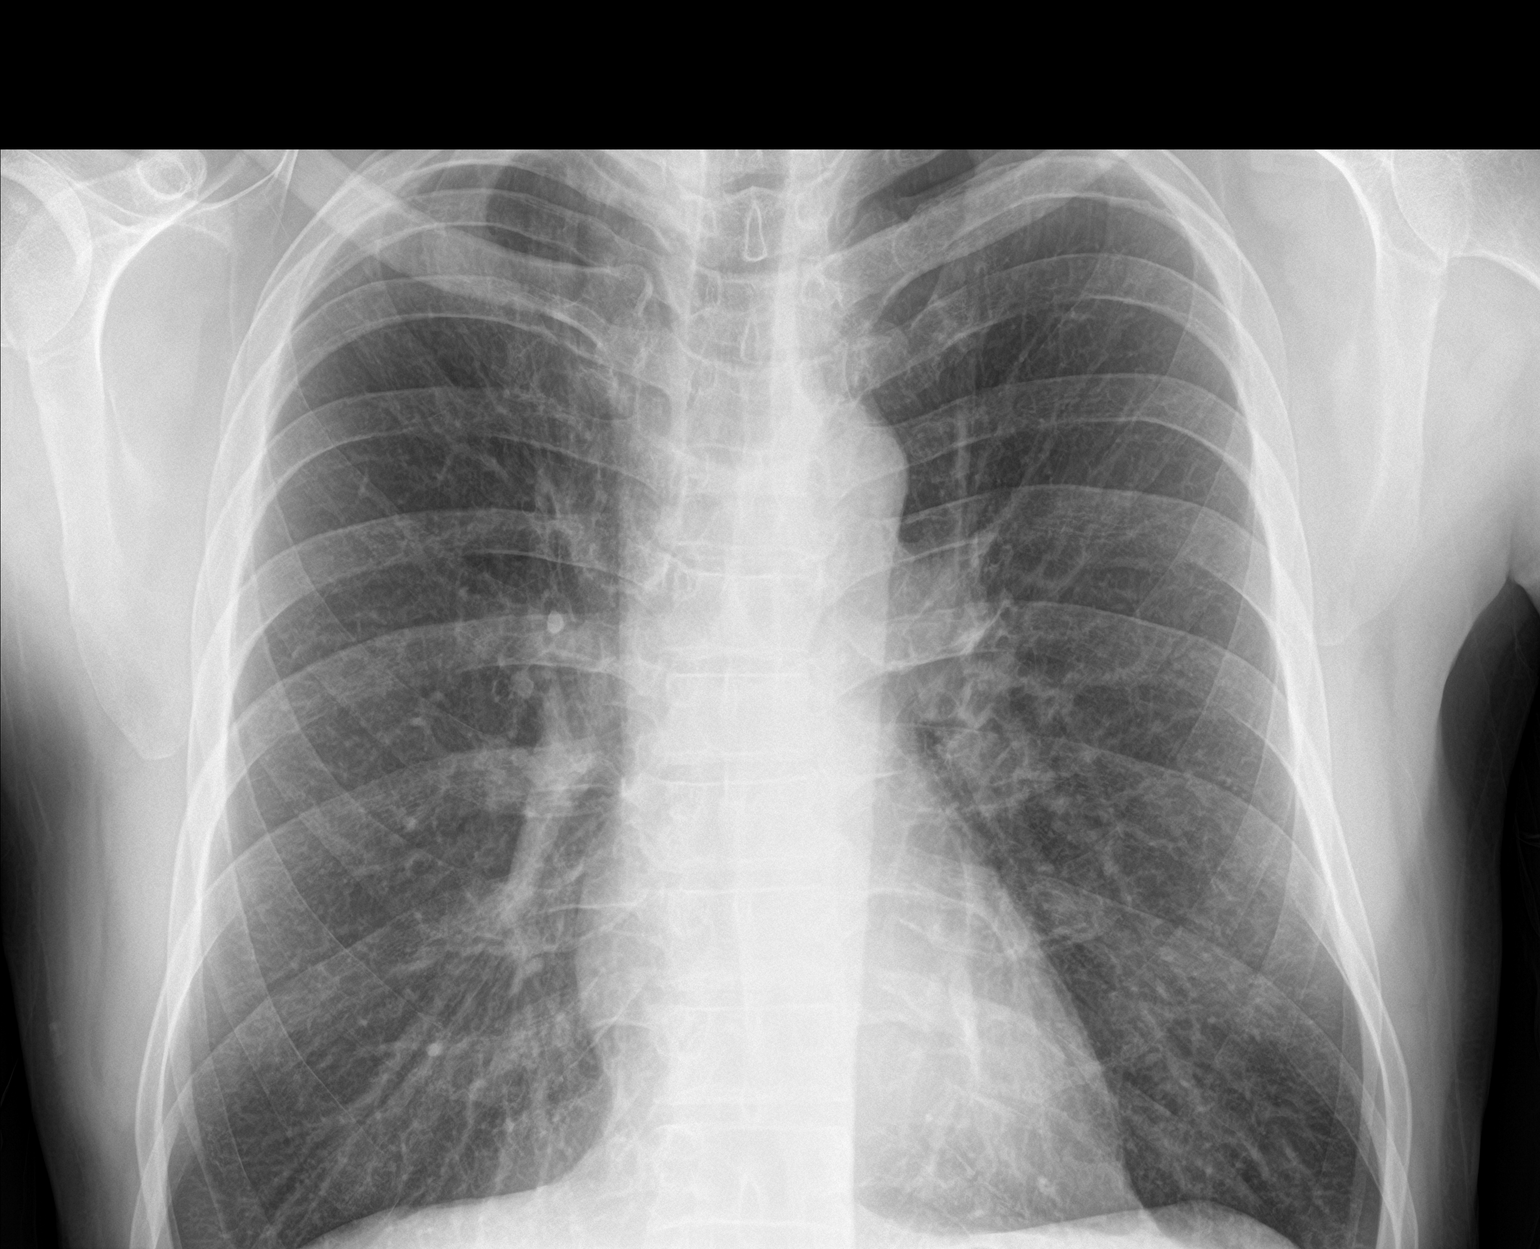
[im 2/2]
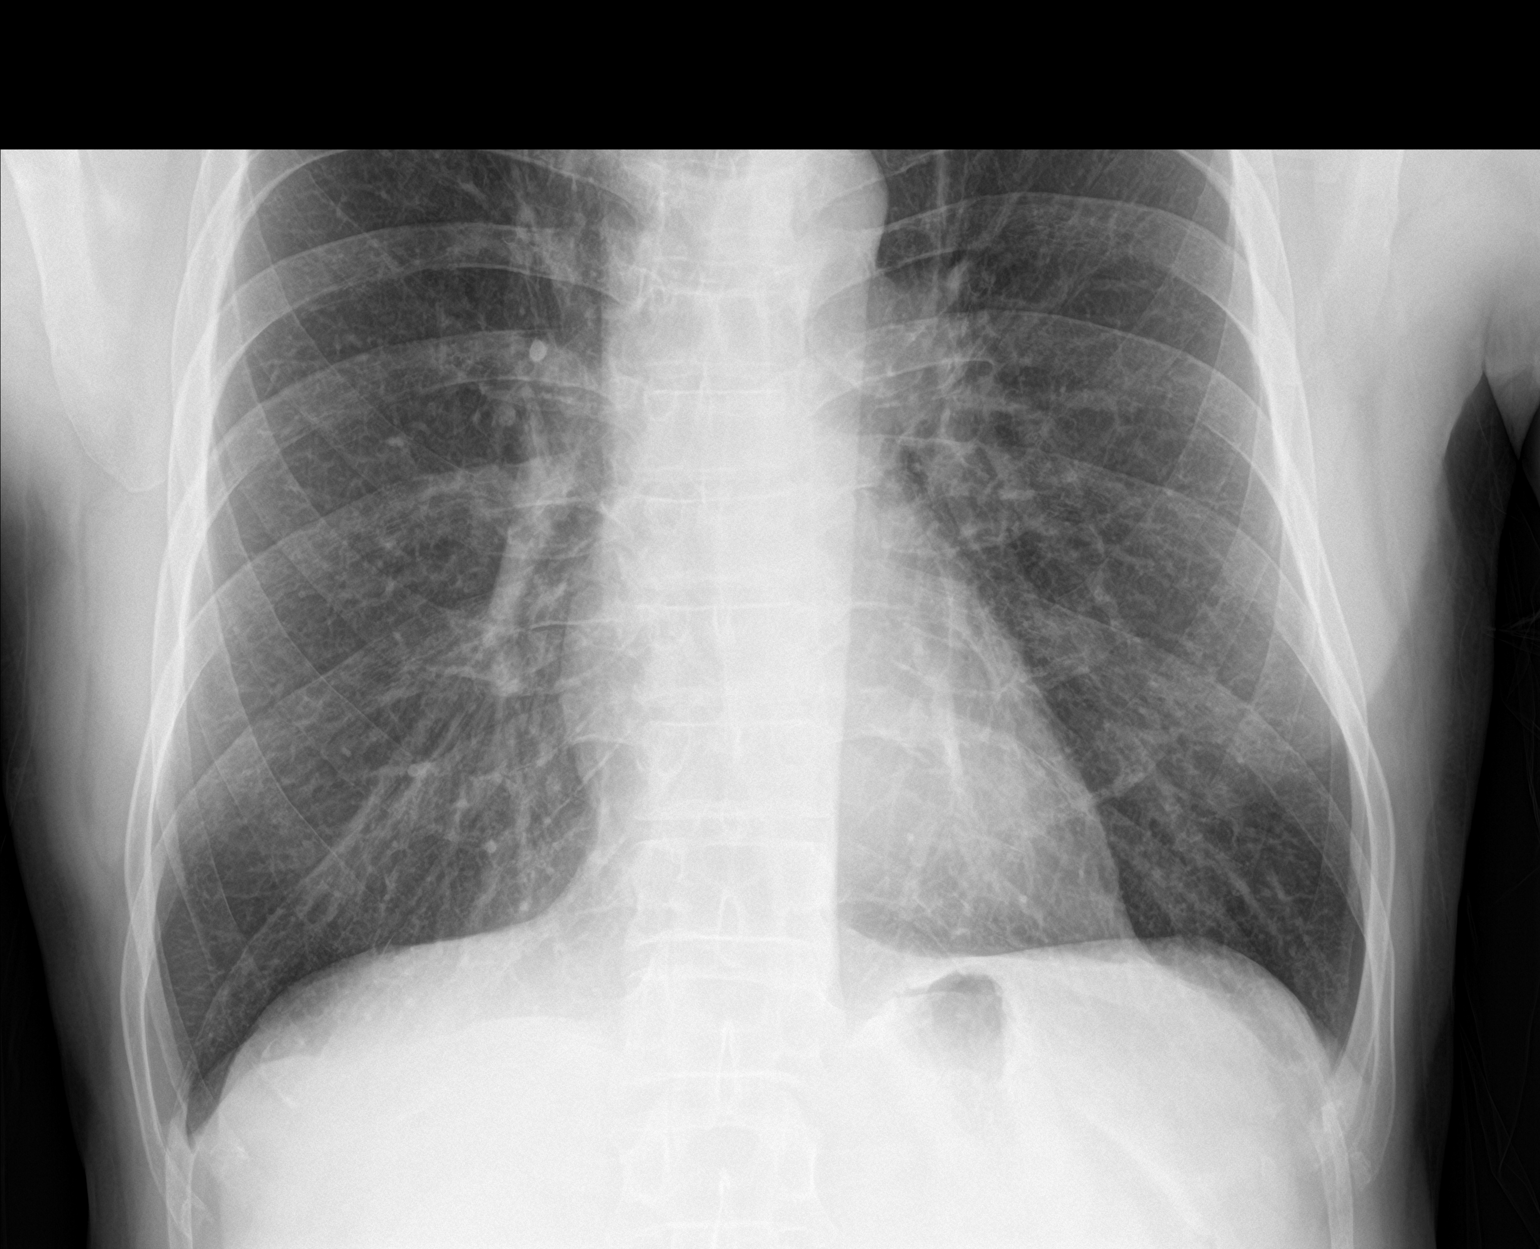

[chest lat]
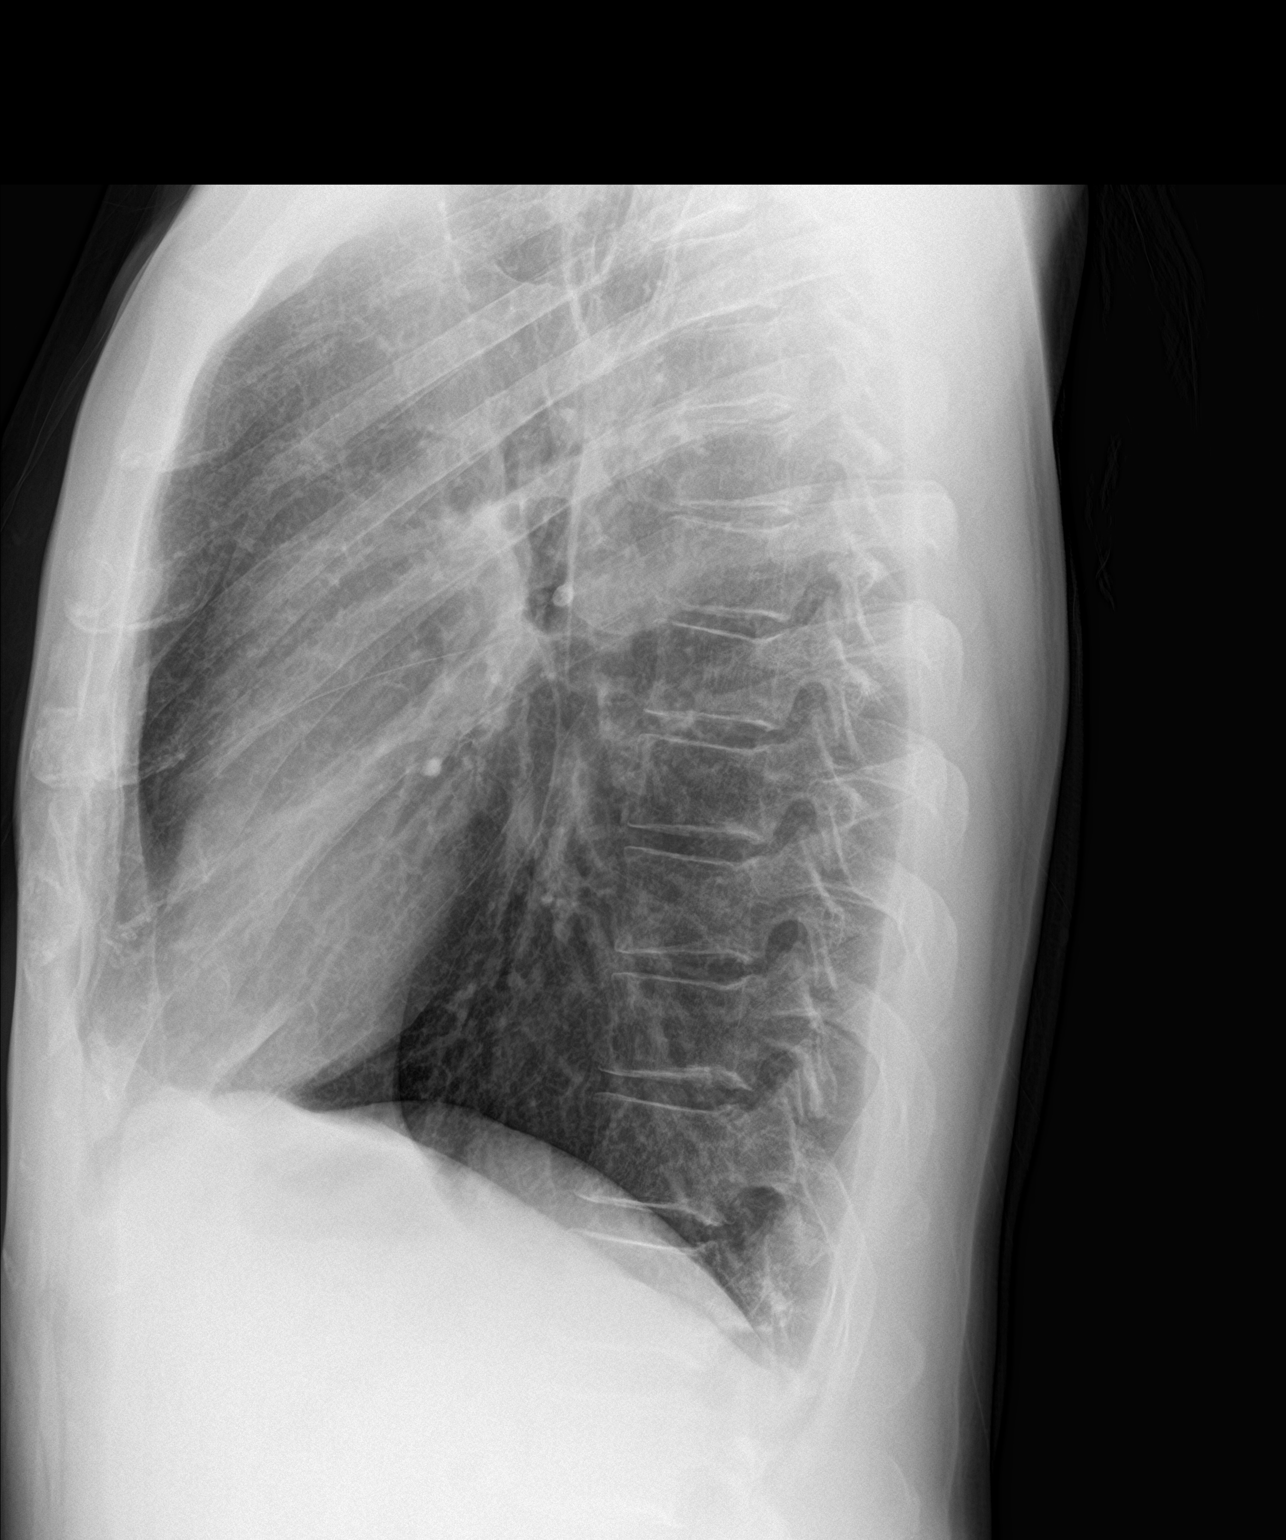

[3 of 3 positions shown; findings below may reference images not displayed]

FINDINGS: The cardiac silhouette is normal in size and configuration. No
mediastinal or hilar masses. No evidence of adenopathy.

Lungs are hyperexpanded but clear. No pleural effusion or
pneumothorax.

Skeletal structures are intact.
IMPRESSION: No active cardiopulmonary disease.

## 2019-04-20 IMAGING — US US THYROID
1 series · 13 of 25 positions shown · non-contrast
Comparison: None.

CLINICAL DATA: Incidental on CT.

EXAM:
THYROID ULTRASOUND
TECHNIQUE: Ultrasound examination of the thyroid gland and adjacent soft
tissues was performed.

[Series 1: us thyroid · 0.04mm/px · 13 of 52 slices shown]
[im 1/52]
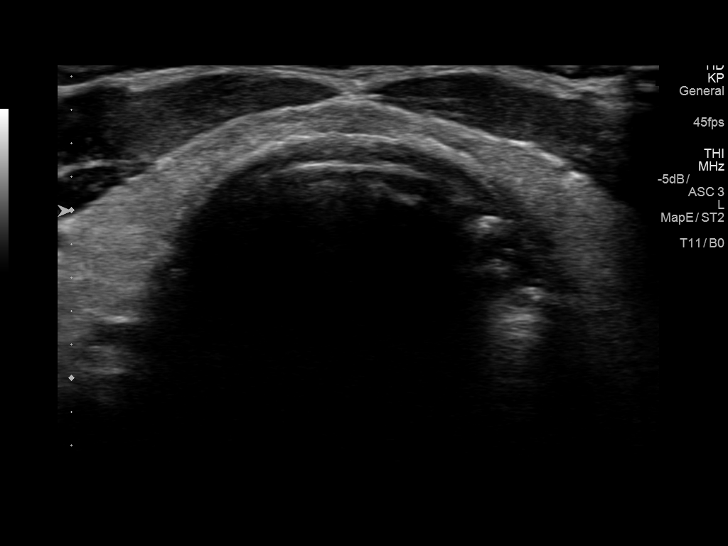
[im 5/52]
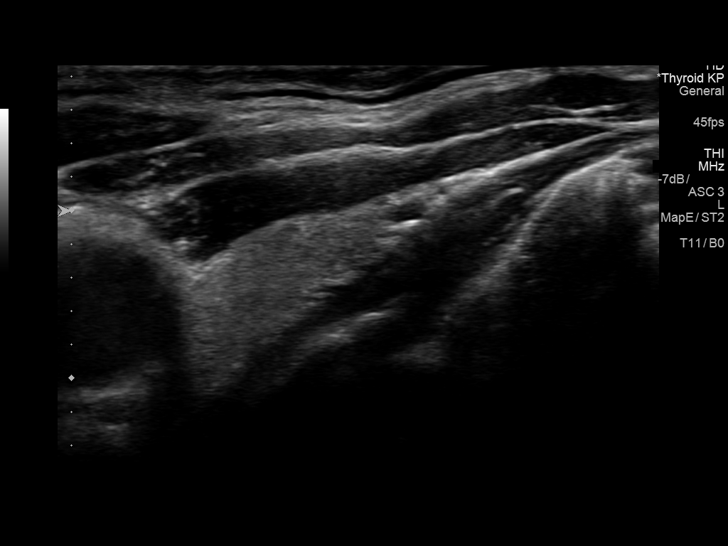
[im 9/52]
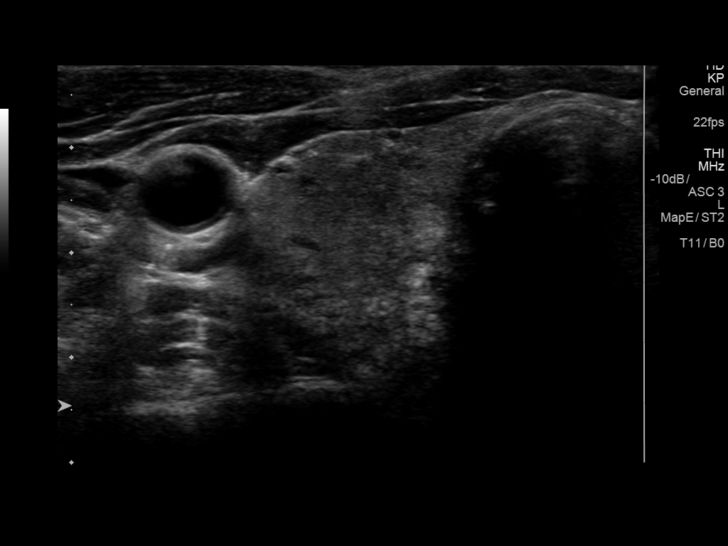
[im 13/52]
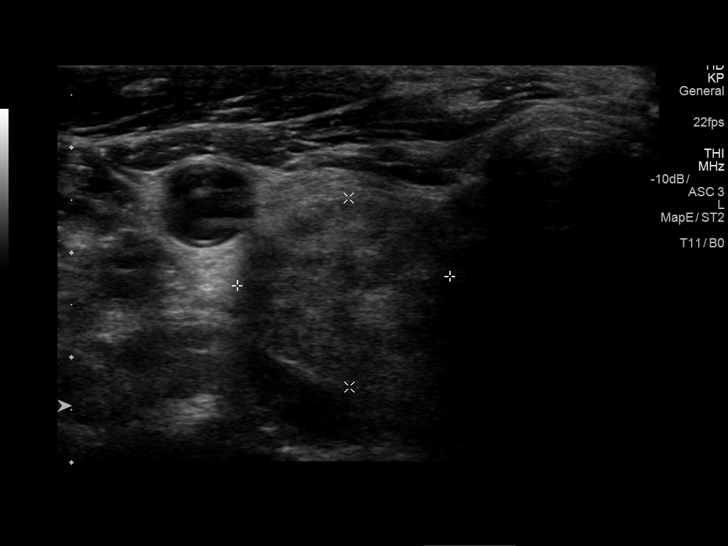
[im 18/52]
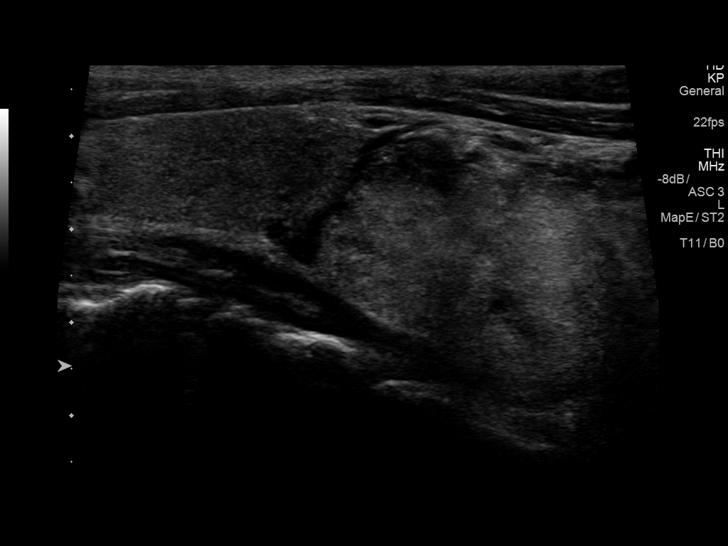
[im 22/52]
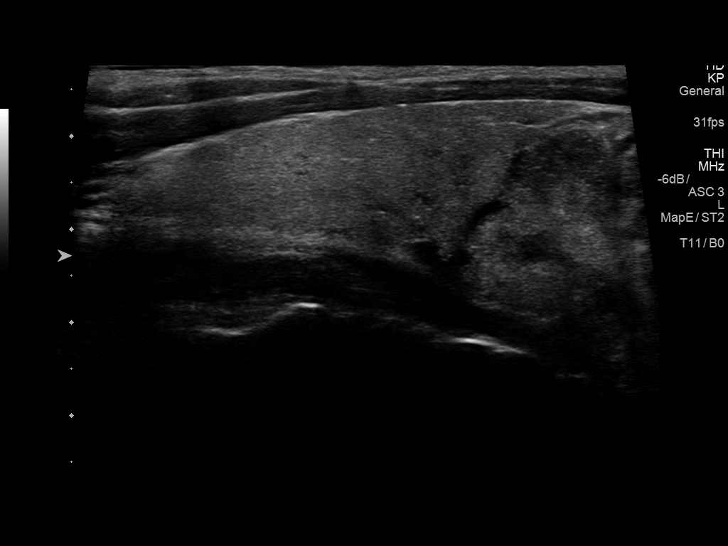
[im 26/52]
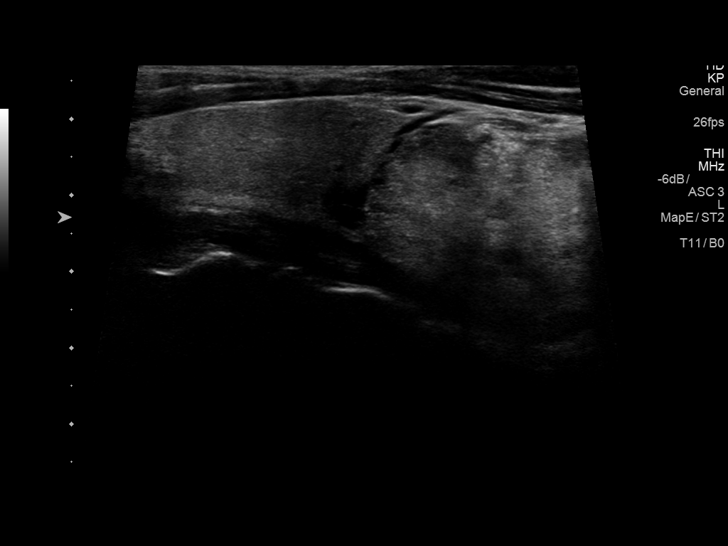
[im 30/52]
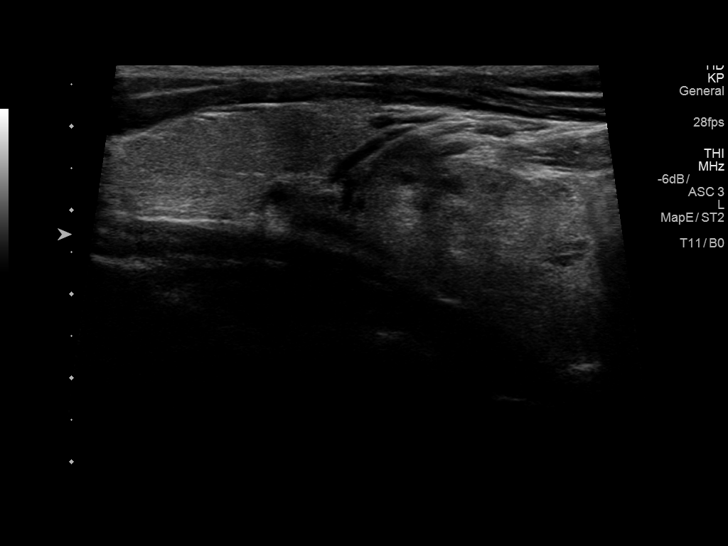
[im 35/52]
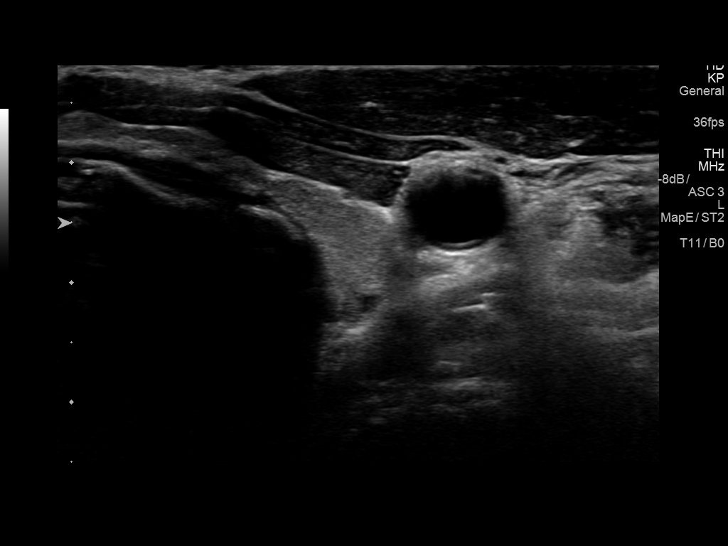
[im 39/52]
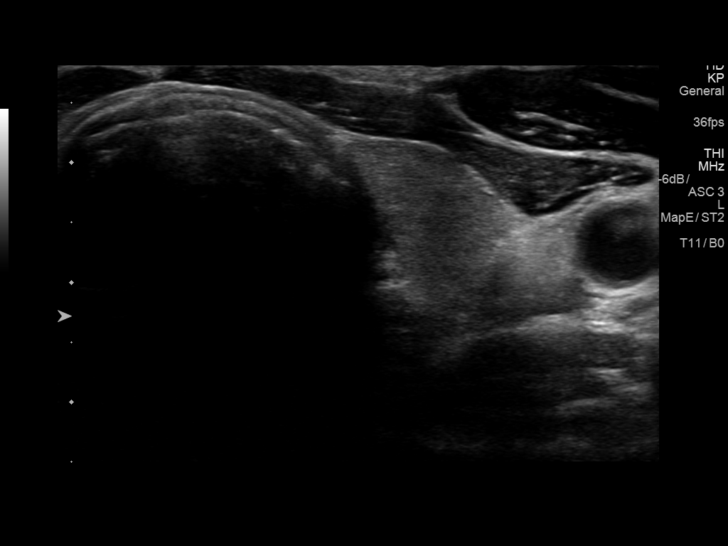
[im 43/52]
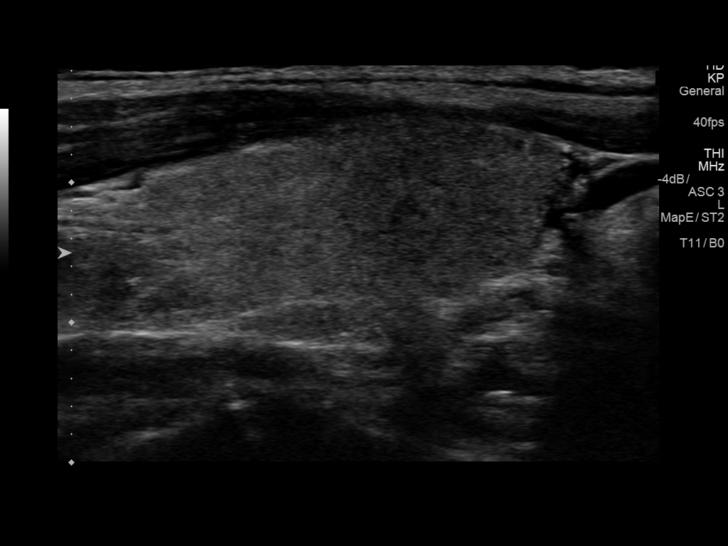
[im 47/52]
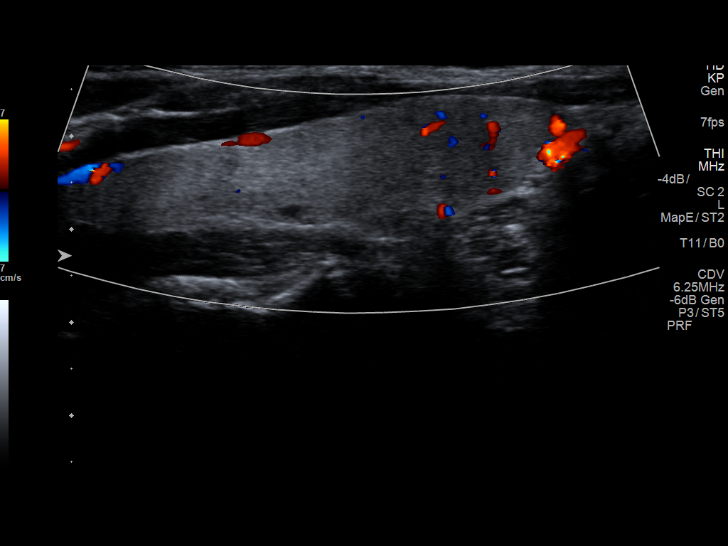
[im 52/52]
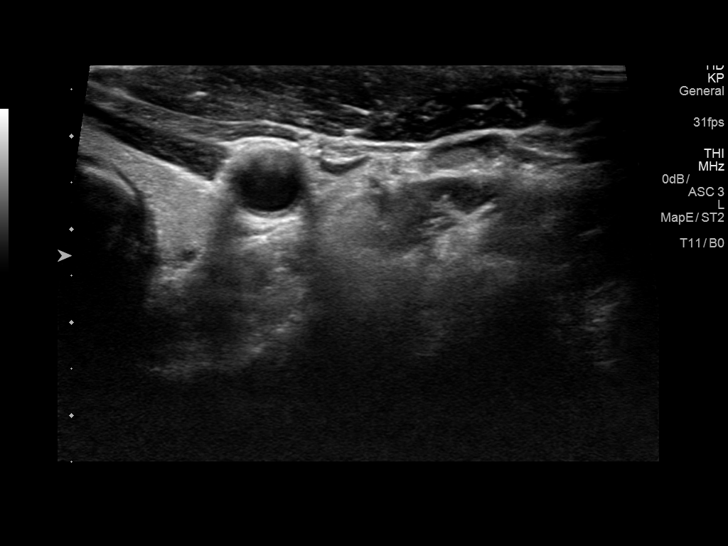

[13 of 25 positions shown; findings below may reference images not displayed]

FINDINGS: Parenchymal Echotexture: Normal

Isthmus: 0.2 cm

Right lobe: 6.5 x 2.2 x 1.9 cm

Left lobe: 5.6 x 1.2 x 1.4 cm

_________________________________________________________

Estimated total number of nodules >/= 1 cm: 1

Number of spongiform nodules >/=  2 cm not described below (TR1): 0

Number of mixed cystic and solid nodules >/= 1.5 cm not described
below (TR2): 0

_________________________________________________________

Nodule # 1:

Location: Right; Inferior

Maximum size: 3.1 cm; Other 2 dimensions: 1.8 x 2.0 cm

Composition: solid/almost completely solid (2)

Echogenicity: hypoechoic (2)

Shape: not taller-than-wide (0)

Margins: smooth (0)

Echogenic foci: none (0)

ACR TI-RADS total points: 4.

ACR TI-RADS risk category: TR4 (4-6 points).

ACR TI-RADS recommendations:

**Given size (>/= 1.5 cm) and appearance, fine needle aspiration of
this moderately suspicious nodule should be considered based on
TI-RADS criteria.

_________________________________________________________
IMPRESSION: Right lower pole nodule 1 meets criteria for biopsy.

The above is in keeping with the ACR TI-RADS recommendations - [HOSPITAL] 1890;[DATE].

## 2019-09-22 DIAGNOSIS — E785 Hyperlipidemia, unspecified: Secondary | ICD-10-CM | POA: Insufficient documentation

## 2019-11-12 DIAGNOSIS — F17210 Nicotine dependence, cigarettes, uncomplicated: Secondary | ICD-10-CM | POA: Insufficient documentation

## 2019-11-12 DIAGNOSIS — Z720 Tobacco use: Secondary | ICD-10-CM | POA: Insufficient documentation

## 2019-11-12 DIAGNOSIS — R9389 Abnormal findings on diagnostic imaging of other specified body structures: Secondary | ICD-10-CM | POA: Insufficient documentation

## 2019-11-12 DIAGNOSIS — J432 Centrilobular emphysema: Secondary | ICD-10-CM | POA: Insufficient documentation

## 2021-10-30 DIAGNOSIS — E041 Nontoxic single thyroid nodule: Secondary | ICD-10-CM | POA: Insufficient documentation

## 2021-11-05 ENCOUNTER — Other Ambulatory Visit: Payer: Self-pay | Admitting: Physician Assistant

## 2021-11-05 DIAGNOSIS — Z122 Encounter for screening for malignant neoplasm of respiratory organs: Secondary | ICD-10-CM

## 2021-12-17 ENCOUNTER — Other Ambulatory Visit: Payer: BLUE CROSS/BLUE SHIELD

## 2021-12-20 DIAGNOSIS — R590 Localized enlarged lymph nodes: Secondary | ICD-10-CM | POA: Insufficient documentation

## 2021-12-31 DIAGNOSIS — R490 Dysphonia: Secondary | ICD-10-CM | POA: Insufficient documentation

## 2022-01-27 HISTORY — PX: THYROIDECTOMY, PARTIAL: SHX18

## 2022-07-24 ENCOUNTER — Encounter: Payer: Self-pay | Admitting: Internal Medicine

## 2022-10-24 ENCOUNTER — Telehealth: Payer: Self-pay | Admitting: Family Medicine

## 2022-10-24 NOTE — Telephone Encounter (Signed)
Pt last seen dr 2019 and pt wife robin is dr fry pt and he would like to re-est with dr fry

## 2022-10-25 NOTE — Telephone Encounter (Signed)
Yes I can see him again  

## 2022-10-25 NOTE — Telephone Encounter (Signed)
Pt has OV appointment for 11/10/21

## 2022-11-11 ENCOUNTER — Encounter: Payer: Self-pay | Admitting: Family Medicine

## 2022-11-11 ENCOUNTER — Ambulatory Visit: Payer: BC Managed Care – PPO | Admitting: Family Medicine

## 2022-11-11 VITALS — BP 118/76 | HR 80 | Temp 98.3°F | Ht 71.5 in | Wt 210.0 lb

## 2022-11-11 DIAGNOSIS — J432 Centrilobular emphysema: Secondary | ICD-10-CM | POA: Diagnosis not present

## 2022-11-11 DIAGNOSIS — C61 Malignant neoplasm of prostate: Secondary | ICD-10-CM

## 2022-11-11 DIAGNOSIS — Z86010 Personal history of colonic polyps: Secondary | ICD-10-CM | POA: Diagnosis not present

## 2022-11-11 DIAGNOSIS — L719 Rosacea, unspecified: Secondary | ICD-10-CM | POA: Diagnosis not present

## 2022-11-11 DIAGNOSIS — F17219 Nicotine dependence, cigarettes, with unspecified nicotine-induced disorders: Secondary | ICD-10-CM

## 2022-11-11 MED ORDER — VARENICLINE TARTRATE 1 MG PO TABS: 1.0000 mg | ORAL_TABLET | Freq: Two times a day (BID) | ORAL | 5 refills | Status: DC

## 2022-11-11 MED ORDER — DOXYCYCLINE HYCLATE 100 MG PO CAPS: 100.0000 mg | ORAL_CAPSULE | Freq: Two times a day (BID) | ORAL | 5 refills | Status: DC

## 2022-11-11 MED ORDER — DICYCLOMINE HCL 20 MG PO TABS: 20.0000 mg | ORAL_TABLET | Freq: Four times a day (QID) | ORAL | 2 refills | Status: AC | PRN

## 2022-11-11 MED ORDER — VARENICLINE TARTRATE (STARTER) 0.5 MG X 11 & 1 MG X 42 PO TBPK: 1.0000 | ORAL_TABLET | Freq: Two times a day (BID) | ORAL | 0 refills | Status: DC

## 2022-11-11 MED ORDER — PREDNISONE 5 MG PO TABS: 5.0000 mg | ORAL_TABLET | ORAL | 0 refills | Status: DC | PRN

## 2022-11-11 NOTE — Progress Notes (Unsigned)
   Subjective:    Patient ID: Gabriel Griffith, male    DOB: 1965/08/22, 57 y.o.   MRN: [id number]  HPI Here to re-establish with [place] for primary care after an absence of 5 years. He had been seeing Dr. Adelene Amas of [facility] for insurance reasons. Since we last saw him he had a right lobe hemithyroidectomy for a large benign nodule, he was diagnosed with emphysema, and he has developed rosacea on the face. He has been applying topical medications to the face with poor results. He admits to some mild SOB on exertion, but he does not cough. He asks for help with quitting smoking. He currently smokes 1.5 ppd of cigarettes in addition to smoking cannabis. He has tried nicotine gum, nicotine patches, and smoking vapes in the past with no success. He is 5 years out from his prostatectomy, and he asks [place] to check a PSA. He is past due for a colonoscopy.    Review of Systems  Constitutional: Negative.   Respiratory:  Positive for shortness of breath. Negative for cough and wheezing.   Cardiovascular: Negative.   Gastrointestinal: Negative.   Genitourinary: Negative.   Neurological: Negative.        Objective:   Physical Exam Constitutional:      Appearance: Normal appearance.  Cardiovascular:     Rate and Rhythm: Normal rate and regular rhythm.     Pulses: Normal pulses.     Heart sounds: Normal heart sounds.  Pulmonary:     Effort: Pulmonary effort is normal.     Breath sounds: Normal breath sounds.  Skin:    Comments: There are patches of erythematous papules on the forehead and cheeks   Neurological:     General: No focal deficit present.     Mental Status: He is alert and oriented to person, place, and time.           Assessment & Plan:  He has acne rosacea, and we will treat this with Doxycycline 100 mg BID. He has emphysema, so we will refer him to [facility]. To help him quit smoking he will begin using Chantix, first with a starter pack and then with  continuing packs. He will chew nicotine gum as needed in addition to the Chantix. We will refer him to [facility] for another colonoscopy. We will check a PSA today for the hx of prostate cancer. We spent a total of (35   ) minutes reviewing records and discussing these issues.  Gershon Crane, MD

## 2022-11-12 ENCOUNTER — Encounter: Payer: Self-pay | Admitting: Family Medicine

## 2022-11-12 DIAGNOSIS — Z860100 Personal history of colon polyps, unspecified: Secondary | ICD-10-CM | POA: Insufficient documentation

## 2022-11-12 DIAGNOSIS — L719 Rosacea, unspecified: Secondary | ICD-10-CM | POA: Insufficient documentation

## 2022-11-12 DIAGNOSIS — J439 Emphysema, unspecified: Secondary | ICD-10-CM | POA: Insufficient documentation

## 2022-11-12 DIAGNOSIS — F17200 Nicotine dependence, unspecified, uncomplicated: Secondary | ICD-10-CM | POA: Insufficient documentation

## 2022-11-12 LAB — PSA: PSA: 0 ng/mL — ABNORMAL LOW (ref 0.10–4.00)

## 2022-11-22 ENCOUNTER — Other Ambulatory Visit: Payer: Self-pay | Admitting: Family Medicine

## 2022-12-06 ENCOUNTER — Institutional Professional Consult (permissible substitution): Payer: BC Managed Care – PPO | Admitting: Pulmonary Disease

## 2022-12-18 ENCOUNTER — Encounter: Payer: Self-pay | Admitting: Pulmonary Disease

## 2022-12-18 ENCOUNTER — Ambulatory Visit: Payer: BC Managed Care – PPO | Admitting: Pulmonary Disease

## 2022-12-18 VITALS — BP 124/76 | HR 78 | Ht 70.0 in | Wt 208.0 lb

## 2022-12-18 DIAGNOSIS — J439 Emphysema, unspecified: Secondary | ICD-10-CM | POA: Diagnosis not present

## 2022-12-18 DIAGNOSIS — F17210 Nicotine dependence, cigarettes, uncomplicated: Secondary | ICD-10-CM | POA: Diagnosis not present

## 2022-12-18 DIAGNOSIS — Z122 Encounter for screening for malignant neoplasm of respiratory organs: Secondary | ICD-10-CM

## 2022-12-18 MED ORDER — ALBUTEROL SULFATE HFA 108 (90 BASE) MCG/ACT IN AERS: 2.0000 | INHALATION_SPRAY | Freq: Four times a day (QID) | RESPIRATORY_TRACT | 6 refills | Status: DC | PRN

## 2022-12-18 NOTE — Patient Instructions (Addendum)
Nice to meet you  Try Stiolto 2 puffs in the morning.  See if this helps, if so send a message and I can prescribe this  I sent a prescription for albuterol rescue inhaler, 2 puffs every 4-6 hours as needed  I ordered a CT scan for ongoing lung cancer screening  Return to clinic in 3 months or sooner as needed with Dr. Judeth Horn

## 2022-12-19 NOTE — Progress Notes (Signed)
@Patient  ID: Gabriel Griffith, male    DOB: [birth year], 57 y.o.   MRN: [id number]  Chief Complaint  Patient presents with   Pulmonary Consult    Referred by Dr. Gershon Crane. Pt had LDCT done 12/18/21. Pt c/o high pitched sound coming from his nose when he breathes in through his nose. He has SOB walking up more than one flight of stairs.     Referring provider: Nelwyn Salisbury, MD  HPI:   57 y.o. man whom we are seeing for evaluation of emphysema, tobacco abuse, and mild dyspnea on exertion.  Note from PCP reviewed.  Most recent ENT note reviewed.  Most recent pulmonary note via [facility] system reviewed.  Patient previously followed by [facility] pul[facility]ologist.  Change in insurance of changing providers.  Well-controlled or minimal symptoms related to COPD.  PFTs reviewed in the past which were very mild fixed obstruction with ratio of 64% and an FEV1 of 88%.  Not on any inhaler.  Although he does describe Some dyspnea on exertion with inclines or stairs.  Maybe get it worse over time.  Previously was undergoing CT lung cancer screening, most recently 12/2021.  He is interesting to reenroll and we discussed this is discussed below with the decision made show I screened,.  He continues to smoke.  Recently met with his PCP.  Planning to start Chantix, has been prescribed.  He is quit in the past for 4 years but picked it back up a few years ago.  He thinks he can quit again.  He is highly motivated.  Questionaires / Pulmonary Flowsheets:   ACT:      No data to display          MMRC:     No data to display          Epworth:      No data to display          Tests:   FENO:  No results found for: "NITRICOXIDE"  PFT:     No data to display          WALK:      No data to display          Imaging: Personally reviewed and as per EMR discussion this note No results found.  Lab Results: Personally reviewed CBC    Component Value  Date/Time   WBC 9.3 09/09/2017 1513   RBC 4.74 09/09/2017 1513   HGB 12.5 (L) 09/19/2017 0453   HCT 37.1 (L) 09/19/2017 0453   PLT 259 09/09/2017 1513   MCV 96.4 09/09/2017 1513   MCH 32.9 09/09/2017 1513   MCHC 34.1 09/09/2017 1513   RDW 13.6 09/09/2017 1513   LYMPHSABS 1.5 06/06/2017 1115   MONOABS 1.1 (H) 06/06/2017 1115   EOSABS 0.1 06/06/2017 1115   BASOSABS 0.0 06/06/2017 1115    BMET    Component Value Date/Time   NA 140 09/09/2017 1513   K 4.7 09/09/2017 1513   CL 107 09/09/2017 1513   CO2 25 09/09/2017 1513   GLUCOSE 88 09/09/2017 1513   BUN 15 09/09/2017 1513   CREATININE 1.07 09/09/2017 1513   CALCIUM 9.0 09/09/2017 1513   GFRNONAA >60 09/09/2017 1513   GFRAA >60 09/09/2017 1513    BNP No results found for: "BNP"  ProBNP No results found for: "PROBNP"  Specialty Problems       Pulmonary Problems   COPD (chronic obstructive pulmonary disease) with emphysema (HCC)  No Known Allergies  Immunization History  Administered Date(s) Administered   Influenza,inj,Quad PF,6+ Mos 07/29/2017   Influenza-Unspecified 07/29/2017   PFIZER Comirnaty(Gray Top)Covid-19 Tri-Sucrose Vaccine 10/20/2019, 04/22/2020   PFIZER(Purple Top)SARS-COV-2 Vaccination 09/29/2019   Tdap 12/07/2021   Zoster Recombinat (Shingrix) 11/05/2021, 03/15/2022    Past Medical History:  Diagnosis Date   Anxiety    Benign tumor of parotid gland    Chronic pelvic pain in male    Complication of anesthesia    difficulty breating after laminectomy because we was not wearing his breath right strips   COPD (chronic obstructive pulmonary disease) with emphysema (HCC)    sees Dr. Octaviano Batty at [facility]   History of thyroid nodule    4cm, benign   Nasal septal deviation    OA (osteoarthritis) of hip    right   Prostate CA (HCC)    Right thyroid nodule    benign   Ruptured disk    L5, S1   Sciatic leg pain    left   Sigmoid diverticulosis     Tobacco History: Social  History   Tobacco Use  Smoking Status Every Day   Packs/day: 2.00   Years: 37.00   Additional pack years: 0.00   Total pack years: 74.00   Types: Cigarettes  Smokeless Tobacco Former   Quit date: 04/20/2017  Tobacco Comments   1.5 ppd 12/18/22 Vernie Murders, CMA   Ready to quit: Not Answered Counseling given: Not Answered Tobacco comments: 1.5 ppd 12/18/22 Vernie Murders, CMA   Recommend stop smoking  Outpatient Encounter Medications as of 12/18/2022  Medication Sig   albuterol (VENTOLIN HFA) 108 (90 Base) MCG/ACT inhaler Inhale 2 puffs into the lungs every 6 (six) hours as needed for wheezing or shortness of breath.   Cholecalciferol (VITAMIN D3) 50 MCG (2000 UT) capsule Take 2,000 Units by mouth daily.   dicyclomine (BENTYL) 20 MG tablet Take 1 tablet (20 mg total) by mouth every 6 (six) hours as needed for spasms.   doxycycline (VIBRAMYCIN) 100 MG capsule Take 1 capsule (100 mg total) by mouth 2 (two) times daily.   Menaquinone-7 (VITAMIN K2 PO) Take by mouth daily at 12 noon.   psyllium (METAMUCIL) 58.6 % packet Take 1 packet by mouth daily.   predniSONE (DELTASONE) 5 MG tablet Take 1 tablet (5 mg total) by mouth as needed (cramps). (Patient not taking: Reported on 12/18/2022)   varenicline (CHANTIX CONTINUING MONTH PAK) 1 MG tablet Take 1 tablet (1 mg total) by mouth 2 (two) times daily. (Patient not taking: Reported on 12/18/2022)   Varenicline Tartrate, Starter, (CHANTIX STARTING MONTH PAK) 0.5 MG X 11 & 1 MG X 42 TBPK Take 1 tablet by mouth 2 (two) times daily at 10 AM and 5 PM. (Patient not taking: Reported on 12/18/2022)   No facility-administered encounter medications on file as of 12/18/2022.     Review of Systems  Review of Systems  No chest pain with exertion.  No orthopnea or PND.  Comprehensive review of systems otherwise negative. Physical Exam  BP 124/76 (BP Location: Left Arm, Cuff Size: Normal)   Pulse 78   Ht 5\' 10"  (1.778 m)   Wt 208 lb (94.3 kg)   SpO2  98% Comment: on RA  BMI 29.84 kg/m   Wt Readings from Last 5 Encounters:  12/18/22 208 lb (94.3 kg)  11/11/22 210 lb (95.3 kg)  09/18/17 192 lb (87.1 kg)  09/09/17 192 lb (87.1 kg)  08/12/17 188 lb (85.3 kg)  BMI Readings from Last 5 Encounters:  12/18/22 29.84 kg/m  11/11/22 28.88 kg/m  09/18/17 26.41 kg/m  09/09/17 26.41 kg/m  08/12/17 26.22 kg/m     Physical Exam General: Sitting in chair, no distress Eyes: EOMI, no icterus Neck: Supple, no JVP Pulmonary: Clear, normal work of breathing Cardiovascular: Warm, no edema Abdomen: Nondistended, bowel sounds present MSK: No synovitis, no joint effusion Neuro: Normal gait, no weakness Psych: Normal mood, full affect  Assessment & Plan:   Dyspnea on exertion: Suspect was related to mild COPD depression on prior PFTs.  Trial of Stiolto.  Albuterol as needed.  Both prescribed today.  Tobacco abuse: Discussed lung cancer screening today.  Use no decision made, should I screen.com, patient is 8.1% risk of developing lung cancer next 6 years.  Discussed risk and benefits of lung cancer screening.  After shared decision-making, he agreed to enroll in lung cancer screening.  Low-dose CT scan ordered today.  Smoking assessment and cessation counseling I have advised the patient to quit/stop smoking as soon as possible due to high risk for multiple medical problems.  It will also be very difficult for [place] to manage patient's  respiratory symptoms and status if we continue to expose her lungs to a known irritant.  Patient is willing to quit smoking. I have advised the patient that we can assist and have options of nicotine replacement therapy, provided smoking cessation education today, provided smoking cessation counseling, and provided cessation resources.  Recently prescribed Chantix via PCP and plans to follow through with this.  Follow-up next office visit office visit for assessment of smoking cessation.  I spent 3 minutes in  smoking cessation counseling.   Return in about 3 months (around 03/20/2023).   Karren Burly, MD 12/19/2022   This appointment required 65 minutes of patient care (this includes precharting, chart review, review of results, face-to-face care, etc.).

## 2022-12-25 ENCOUNTER — Other Ambulatory Visit: Payer: BC Managed Care – PPO

## 2023-01-03 ENCOUNTER — Telehealth: Payer: Self-pay

## 2023-01-03 ENCOUNTER — Ambulatory Visit (AMBULATORY_SURGERY_CENTER): Payer: BC Managed Care – PPO

## 2023-01-03 VITALS — Ht 70.0 in | Wt 209.0 lb

## 2023-01-03 DIAGNOSIS — Z86010 Personal history of colonic polyps: Secondary | ICD-10-CM

## 2023-01-03 MED ORDER — NA SULFATE-K SULFATE-MG SULF 17.5-3.13-1.6 GM/177ML PO SOLN
1.0000 | Freq: Once | ORAL | 0 refills | Status: AC
Start: 2023-01-03 — End: 2023-01-03

## 2023-01-03 NOTE — Progress Notes (Signed)
No egg or soy allergy known to patient  No issues known to pt with past sedation with any surgeries or procedures Patient denies ever being told they had issues or difficulty with intubation  No FH of Malignant Hyperthermia Pt is not on diet pills Pt is not on  home 02  Pt is not on blood thinners  Pt denies issues with constipation  No A fib or A flutter Have any cardiac testing pending--no  LOA: Independent  Prep: suprep   Patient's chart reviewed by Cathlyn Parsons CNRA prior to previsit and patient appropriate for the [facility].  Previsit completed and red dot placed by patient's name on their procedure day (on provider's schedule).     PV competed with patient. Prep instructions sent via mychart and home address. Goodrx coupon for [facility]  provided to use for price reduction if needed.

## 2023-01-03 NOTE — Telephone Encounter (Signed)
Phone call returned by patient PV completed

## 2023-01-05 ENCOUNTER — Encounter: Payer: Self-pay | Admitting: Internal Medicine

## 2023-01-17 ENCOUNTER — Encounter: Payer: BC Managed Care – PPO | Admitting: Internal Medicine

## 2023-01-29 ENCOUNTER — Other Ambulatory Visit: Payer: Self-pay | Admitting: *Deleted

## 2023-02-13 ENCOUNTER — Inpatient Hospital Stay: Admission: RE | Admit: 2023-02-13 | Payer: BC Managed Care – PPO | Source: Ambulatory Visit

## 2023-05-19 ENCOUNTER — Ambulatory Visit: Payer: BC Managed Care – PPO | Admitting: Family Medicine

## 2023-05-19 ENCOUNTER — Encounter: Payer: Self-pay | Admitting: Family Medicine

## 2023-05-19 VITALS — BP 110/72 | HR 78 | Temp 98.0°F | Wt 216.0 lb

## 2023-05-19 DIAGNOSIS — F17219 Nicotine dependence, cigarettes, with unspecified nicotine-induced disorders: Secondary | ICD-10-CM

## 2023-05-19 NOTE — Progress Notes (Signed)
   Subjective:    Patient ID: Gabriel Griffith, male    DOB: 1966-05-04, 57 y.o.   MRN: [id number]  HPI Here asking for help to quite smoking. He has emphysema, and he wants to quit but is struggling. He has tried nicotine gums and patches with no results. He has tried to vape rather than smoke cigarettes, but this has not worked either. We prescribed him Chantix several months ago, but he never tried this. His wife and a friend had bad side effects from it, so he is afraid to try it.    Review of Systems  Constitutional: Negative.   Respiratory:  Positive for shortness of breath. Negative for cough and wheezing.   Cardiovascular: Negative.        Objective:   Physical Exam Constitutional:      Appearance: Normal appearance.  Cardiovascular:     Rate and Rhythm: Normal rate and regular rhythm.     Pulses: Normal pulses.     Heart sounds: Normal heart sounds.  Pulmonary:     Effort: Pulmonary effort is normal.     Breath sounds: Normal breath sounds.  Neurological:     Mental Status: He is alert.           Assessment & Plan:  Nicotine dependence. I suggested he try taking Naltrexone 50 mg daily. I told him the studies on using this to quit smoking are limited, but it seems to work quite well and it has very few side effects. He was excited about this, but he wants to read more about it and consult with his wife before making any decisions.  Gershon Crane, MD

## 2024-01-23 ENCOUNTER — Encounter: Payer: Self-pay | Admitting: Advanced Practice Midwife

## 2024-03-10 ENCOUNTER — Other Ambulatory Visit: Payer: Self-pay | Admitting: Family Medicine

## 2024-05-05 ENCOUNTER — Telehealth: Payer: Self-pay | Admitting: *Deleted

## 2024-05-05 NOTE — Telephone Encounter (Signed)
 I am not sure what he means. He has never had a chest CT that I can see. If this is for his hx of melanoma, we do a simple chest Xray

## 2024-05-05 NOTE — Telephone Encounter (Signed)
 Copied from CRM [id number]. Topic: Clinical - Request for Lab/Test Order >> May 05, 2024 11:11 AM Mercedes MATSU wrote: Reason for CRM: Patient called in requesting dr fry reorder his chest ct scan. Patient is requesting a call back at [phone] once order is placed to be assisted with scheduling.

## 2024-07-12 ENCOUNTER — Ambulatory Visit: Payer: Self-pay

## 2024-07-12 ENCOUNTER — Ambulatory Visit: Payer: Self-pay | Admitting: Family Medicine

## 2024-07-12 NOTE — Telephone Encounter (Signed)
 FYI Only or Action Required?: FYI only for provider: appointment scheduled on 1/6.  Patient was last seen in primary care on 05/19/2023 by Johnny Garnette LABOR, MD.  Called Nurse Triage reporting Cough and Sore Throat.  Symptoms began a week ago.  Interventions attempted: OTC medications: tylenol , salt water rinses.  Symptoms are: gradually worsening.  Triage Disposition: See Physician Within 24 Hours  Patient/caregiver understands and will follow disposition?: Yes  Copied from CRM #[id number]. Topic: Clinical - Red Word Triage >> Jul 12, 2024  4:01 PM Wess RAMAN wrote: Red Word that prompted transfer to Nurse Triage: cough and sore throat for 10 days Reason for Disposition  Earache also present  Answer Assessment - Initial Assessment Questions For last 10 days- sore throat, dry cough, sinus pain without congestion, and now ear pain. Sore throat came first 3/10 pain and fluctuates. One day of yellow coating to mouth and burning with concern for thrush but resolved the next day. Denies white patches on back of throat and no strep exposure that he is aware of.   Denies sinus congestion but has had some sinus pain- cheeks below his eyes, bilaterally.  Ear pain and congestion. Ears popping.  Tylenol  a few times- Salt water gargle  Coffee helping sooth his throat  Appt with PCP office 1/6 to assess. ED/UC precautions understood.   1. ONSET: When did the throat start hurting? (Hours or days ago)      10 days  2. SEVERITY: How bad is the sore throat? (Scale 1-10; mild, moderate or severe)     3/10 3. STREP EXPOSURE: Has there been any exposure to strep within the past week? If Yes, ask: What type of contact occurred?      Denies  4.  VIRAL SYMPTOMS: Are there any symptoms of a cold, such as a runny nose, cough, hoarse voice or red eyes?      Cough, eyes have been crusty throughout the day, ear pain, sinus pain  5. FEVER: Do you have a fever? If Yes, ask: What is your  temperature, how was it measured, and when did it start?     Mild but not measured 6. PUS ON THE TONSILS: Is there pus on the tonsils in the back of your throat?     Unable to fully visualize  7. OTHER SYMPTOMS: Do you have any other symptoms? (e.g., difficulty breathing, headache, rash)     cough  Protocols used: Sore Throat-A-AH

## 2024-07-12 NOTE — Telephone Encounter (Signed)
 Noted

## 2024-07-13 ENCOUNTER — Encounter: Payer: Self-pay | Admitting: Family Medicine

## 2024-07-13 ENCOUNTER — Ambulatory Visit: Admitting: Family Medicine

## 2024-07-13 VITALS — BP 110/70 | HR 80 | Temp 98.2°F | Ht 70.0 in | Wt 208.1 lb

## 2024-07-13 DIAGNOSIS — J029 Acute pharyngitis, unspecified: Secondary | ICD-10-CM

## 2024-07-13 DIAGNOSIS — J0100 Acute maxillary sinusitis, unspecified: Secondary | ICD-10-CM | POA: Diagnosis not present

## 2024-07-13 DIAGNOSIS — J020 Streptococcal pharyngitis: Secondary | ICD-10-CM | POA: Diagnosis not present

## 2024-07-13 LAB — POCT RAPID STREP A (OFFICE): Rapid Strep A Screen: POSITIVE — AB

## 2024-07-13 MED ORDER — AMOXICILLIN-POT CLAVULANATE 875-125 MG PO TABS: 1.0000 | ORAL_TABLET | Freq: Two times a day (BID) | ORAL | 0 refills | Status: DC

## 2024-07-13 NOTE — Progress Notes (Signed)
 "  Acute Office Visit  Subjective:     Patient ID: Gabriel Griffith, male    DOB: 1966-03-11, 59 y.o.   MRN: [id number]  Chief Complaint  Patient presents with   Sinus Problem    Patient complains sinus pain and facial pain x10-12 days   Sore Throat    X10-12 days, also burning mouth noted   Ear Pain    Bilateral ear pain intermittently x10-12 days    Sinus Problem  Sore Throat    Discussed the use of AI scribe software for clinical note transcription with the patient, who gave verbal consent to proceed.  History of Present Illness   Gabriel Griffith is a 59 year old male with emphysema and COPD who presents with upper respiratory symptoms.  He has had mild upper respiratory symptoms for twelve days with sore throat, burning in the mouth, intermittent ear pain, and sinus pain that wax and wane. He denies chest pain, dyspnea, fever, or chills.  He is a heavy smoker with chronic congestion and COPD/emphysema. He does not use inhalers regularly because he did not find them helpful and he occasionally takes prednisone .  He noticed a yellow film in his mouth once that resolved. He has mildly swollen lymph nodes and gum tenderness, with gums that are sensitive and bleed at times.  He has had strep throat in the past and feels this episode is different, noting no headache, which he typically has with strep.       Review of Systems  All other systems reviewed and are negative.       Objective:    BP 110/70   Pulse 80   Temp 98.2 F (36.8 C) (Oral)   Ht 5' 10 (1.778 m)   Wt 208 lb 1.6 oz (94.4 kg)   SpO2 97%   BMI 29.86 kg/m    Physical Exam Vitals reviewed.  Constitutional:      Appearance: Normal appearance. He is well-groomed and normal weight.  HENT:     Right Ear: Tympanic membrane normal.     Left Ear: Tympanic membrane normal.     Mouth/Throat:     Mouth: Mucous membranes are moist.     Pharynx: Posterior oropharyngeal erythema present.  Eyes:      Extraocular Movements: Extraocular movements intact.     Conjunctiva/sclera: Conjunctivae normal.  Cardiovascular:     Rate and Rhythm: Normal rate and regular rhythm.     Heart sounds: S1 normal and S2 normal. No murmur heard. Pulmonary:     Effort: Pulmonary effort is normal.     Breath sounds: Normal breath sounds and air entry. No rales.  Abdominal:     General: Abdomen is flat. Bowel sounds are normal.  Musculoskeletal:     Right lower leg: No edema.     Left lower leg: No edema.  Neurological:     General: No focal deficit present.     Mental Status: He is alert and oriented to person, place, and time.     Gait: Gait is intact.  Psychiatric:        Mood and Affect: Mood and affect normal.     Results for orders placed or performed in visit on 07/13/24  POC Rapid Strep A  Result Value Ref Range   Rapid Strep A Screen Positive (A) Negative        Assessment & Plan:   Problem List Items Addressed This Visit   None Visit Diagnoses  Streptococcal pharyngitis    -  Primary     Sore throat       Relevant Orders   POC Rapid Strep A (Completed)     Acute non-recurrent maxillary sinusitis       Relevant Medications   amoxicillin -clavulanate (AUGMENTIN ) 875-125 MG tablet      Assessment and Plan    Streptococcal pharyngitis Positive strep test confirms streptococcal pharyngitis with symptoms of sore throat, burning mouth, and mildly swollen lymph nodes. No fever or chills. Differential diagnosis included thrush, but examination showed no white patches typical of thrush. Discussed potential for strep carriage and future flare-ups. - Prescribed Augmentin  to treat streptococcal pharyngitis and potential sinus involvement. - Advised to monitor for signs of thrush, such as white patches in the mouth, and to consume active culture yogurt to prevent thrush. - Instructed to start antibiotics immediately and expect symptom improvement in a few days.  Acute maxillary  sinusitis Intermittent sinus pain below the eyes and cheeks, likely related to acute maxillary sinusitis. Symptoms have been present for 12 days. Amoxicillin  alone may not adequately penetrate the sinuses, necessitating a stronger antibiotic. - Prescribed Augmentin  to address both streptococcal pharyngitis and sinusitis.       Meds ordered this encounter  Medications   amoxicillin -clavulanate (AUGMENTIN ) 875-125 MG tablet    Sig: Take 1 tablet by mouth 2 (two) times daily for 7 days.    Dispense:  14 tablet    Refill:  0    No follow-ups on file.  Heron CHRISTELLA Sharper, MD   "

## 2024-07-20 ENCOUNTER — Ambulatory Visit (INDEPENDENT_AMBULATORY_CARE_PROVIDER_SITE_OTHER): Admitting: Family Medicine

## 2024-07-20 ENCOUNTER — Encounter: Payer: Self-pay | Admitting: Family Medicine

## 2024-07-20 VITALS — BP 108/60 | HR 76 | Temp 98.3°F | Wt 212.0 lb

## 2024-07-20 DIAGNOSIS — J020 Streptococcal pharyngitis: Secondary | ICD-10-CM

## 2024-07-20 DIAGNOSIS — L739 Follicular disorder, unspecified: Secondary | ICD-10-CM | POA: Diagnosis not present

## 2024-07-20 DIAGNOSIS — J019 Acute sinusitis, unspecified: Secondary | ICD-10-CM

## 2024-07-20 MED ORDER — METRONIDAZOLE 1 % EX GEL: Freq: Every day | CUTANEOUS | 2 refills | Status: AC

## 2024-07-20 MED ORDER — DOXYCYCLINE HYCLATE 100 MG PO TABS: 100.0000 mg | ORAL_TABLET | Freq: Two times a day (BID) | ORAL | 0 refills | Status: AC

## 2024-07-20 NOTE — Progress Notes (Signed)
" ° °  Subjective:    Patient ID: Gabriel Griffith, male    DOB: 03-Feb-1966, 59 y.o.   MRN: [id number]  HPI Here for 2 issues. First he has had 3 weeks of sinus congestion, pressure in both ears, PND, ST, and a dry cough. No fever. In addition, for 10 years or more he has had a recurrent infection on his cheeks which causes tender pimples to appear. These sometimes come to a head and drain. The right cheek is always more affected than the left. This rash comes and goes, and he has had the current flare for several weeks. He was seen by Dr. Ozell on 07-14-23 for the respiratory symptoms, and he tested positive for a Strep infection. He was given 7 days of Augmentin , but he says this has not helped at all. He is drinking fluids.   Review of Systems  Constitutional: Negative.   HENT:  Positive for congestion, ear pain, postnasal drip, sinus pressure and sore throat.   Eyes: Negative.   Respiratory:  Positive for cough. Negative for shortness of breath and wheezing.   Skin:  Positive for rash.       Objective:   Physical Exam Constitutional:      Appearance: Normal appearance.  HENT:     Right Ear: Tympanic membrane, ear canal and external ear normal.     Left Ear: Tympanic membrane, ear canal and external ear normal.     Nose: Nose normal.     Mouth/Throat:     Pharynx: Oropharynx is clear.  Eyes:     Conjunctiva/sclera: Conjunctivae normal.  Pulmonary:     Effort: Pulmonary effort is normal.     Breath sounds: Normal breath sounds.  Lymphadenopathy:     Cervical: No cervical adenopathy.  Skin:    Comments: There is a cluster of papular lesions on the right cheek. These are red and mildly tender  Neurological:     Mental Status: He is alert.           Assessment & Plan:  He recently tested positive for a Strep pharyngitis, and presumably this was adequately treated with the Augmentin . However he also has a sinusitis that persists, and we will treat this with 10 days of  Doxycycline . 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  Garnette Olmsted, MD   "
